# Patient Record
Sex: Female | Born: 1975 | Race: White | Hispanic: No | Marital: Married | Smoking: Former smoker
Health system: Southern US, Community
[De-identification: ages and names within clinical notes are randomized; demographics above are authoritative.]

## PROBLEM LIST (undated history)

## (undated) ENCOUNTER — Inpatient Hospital Stay: Admission: EM | Payer: Self-pay | Source: Home / Self Care

## (undated) ENCOUNTER — Inpatient Hospital Stay (HOSPITAL_COMMUNITY): Payer: Medicaid Other

## (undated) ENCOUNTER — Inpatient Hospital Stay (HOSPITAL_COMMUNITY): Payer: Self-pay

## (undated) DIAGNOSIS — J189 Pneumonia, unspecified organism: Secondary | ICD-10-CM

## (undated) DIAGNOSIS — O99320 Drug use complicating pregnancy, unspecified trimester: Secondary | ICD-10-CM

## (undated) DIAGNOSIS — R87629 Unspecified abnormal cytological findings in specimens from vagina: Secondary | ICD-10-CM

## (undated) DIAGNOSIS — O26892 Other specified pregnancy related conditions, second trimester: Secondary | ICD-10-CM

## (undated) DIAGNOSIS — O039 Complete or unspecified spontaneous abortion without complication: Secondary | ICD-10-CM

## (undated) DIAGNOSIS — Z6791 Unspecified blood type, Rh negative: Secondary | ICD-10-CM

## (undated) DIAGNOSIS — K219 Gastro-esophageal reflux disease without esophagitis: Secondary | ICD-10-CM

## (undated) DIAGNOSIS — R110 Nausea: Secondary | ICD-10-CM

## (undated) DIAGNOSIS — F419 Anxiety disorder, unspecified: Secondary | ICD-10-CM

## (undated) DIAGNOSIS — O0990 Supervision of high risk pregnancy, unspecified, unspecified trimester: Secondary | ICD-10-CM

## (undated) HISTORY — DX: Other specified pregnancy related conditions, second trimester: O26.892

## (undated) HISTORY — DX: Nausea: R11.0

## (undated) HISTORY — DX: Unspecified blood type, rh negative: Z67.91

## (undated) HISTORY — DX: Complete or unspecified spontaneous abortion without complication: O03.9

## (undated) HISTORY — DX: Drug use complicating pregnancy, unspecified trimester: O99.320

## (undated) HISTORY — DX: Supervision of high risk pregnancy, unspecified, unspecified trimester: O09.90

## (undated) HISTORY — DX: Anxiety disorder, unspecified: F41.9

---

## 2003-10-09 ENCOUNTER — Other Ambulatory Visit: Admission: RE | Admit: 2003-10-09 | Discharge: 2003-10-09 | Payer: Self-pay | Admitting: Obstetrics and Gynecology

## 2006-05-18 HISTORY — PX: SPINE SURGERY: SHX786

## 2006-11-01 ENCOUNTER — Emergency Department (HOSPITAL_COMMUNITY): Admission: EM | Admit: 2006-11-01 | Discharge: 2006-11-01 | Payer: Self-pay | Admitting: Emergency Medicine

## 2007-07-19 ENCOUNTER — Other Ambulatory Visit: Payer: Self-pay

## 2007-07-19 ENCOUNTER — Emergency Department: Payer: Self-pay | Admitting: Emergency Medicine

## 2007-08-29 ENCOUNTER — Emergency Department: Payer: Self-pay | Admitting: Emergency Medicine

## 2008-02-09 ENCOUNTER — Other Ambulatory Visit: Admission: RE | Admit: 2008-02-09 | Discharge: 2008-02-09 | Payer: Self-pay | Admitting: Family Medicine

## 2008-09-14 ENCOUNTER — Observation Stay: Payer: Self-pay | Admitting: *Deleted

## 2009-01-16 ENCOUNTER — Emergency Department (HOSPITAL_COMMUNITY): Admission: EM | Admit: 2009-01-16 | Discharge: 2009-01-16 | Payer: Self-pay | Admitting: Emergency Medicine

## 2010-04-22 ENCOUNTER — Emergency Department (HOSPITAL_COMMUNITY)
Admission: EM | Admit: 2010-04-22 | Discharge: 2010-04-23 | Disposition: A | Discharge status: Other Acute Inpatient Hospital | Payer: Self-pay | Source: Home / Self Care | Admitting: Emergency Medicine

## 2010-04-23 ENCOUNTER — Inpatient Hospital Stay (HOSPITAL_COMMUNITY)
Admission: RE | Admit: 2010-04-23 | Discharge: 2010-04-30 | Payer: Self-pay | Source: Home / Self Care | Attending: Psychiatry | Admitting: Psychiatry

## 2010-07-29 LAB — URINALYSIS, ROUTINE W REFLEX MICROSCOPIC
Bilirubin Urine: NEGATIVE
Glucose, UA: NEGATIVE mg/dL
Ketones, ur: NEGATIVE mg/dL
Leukocytes, UA: NEGATIVE
Nitrite: NEGATIVE
Protein, ur: NEGATIVE mg/dL
Specific Gravity, Urine: 1.023 (ref 1.005–1.030)
Urobilinogen, UA: 0.2 mg/dL (ref 0.0–1.0)
pH: 6.5 (ref 5.0–8.0)

## 2010-07-29 LAB — DIFFERENTIAL
Basophils Absolute: 0 10*3/uL (ref 0.0–0.1)
Basophils Relative: 0 % (ref 0–1)
Eosinophils Absolute: 0 10*3/uL (ref 0.0–0.7)
Eosinophils Relative: 0 % (ref 0–5)
Lymphocytes Relative: 12 % (ref 12–46)
Lymphs Abs: 1.6 10*3/uL (ref 0.7–4.0)
Monocytes Absolute: 0.8 10*3/uL (ref 0.1–1.0)
Monocytes Relative: 6 % (ref 3–12)
Neutro Abs: 11.4 10*3/uL — ABNORMAL HIGH (ref 1.7–7.7)
Neutrophils Relative %: 82 % — ABNORMAL HIGH (ref 43–77)

## 2010-07-29 LAB — URINE MICROSCOPIC-ADD ON

## 2010-07-29 LAB — POCT PREGNANCY, URINE
Preg Test, Ur: NEGATIVE
Preg Test, Ur: NEGATIVE

## 2010-07-29 LAB — CBC
HCT: 39 % (ref 36.0–46.0)
Hemoglobin: 13.7 g/dL (ref 12.0–15.0)
MCH: 30.6 pg (ref 26.0–34.0)
MCHC: 35.1 g/dL (ref 30.0–36.0)
MCV: 87.1 fL (ref 78.0–100.0)
Platelets: 247 10*3/uL (ref 150–400)
RBC: 4.48 MIL/uL (ref 3.87–5.11)
RDW: 13.7 % (ref 11.5–15.5)
WBC: 13.8 10*3/uL — ABNORMAL HIGH (ref 4.0–10.5)

## 2010-07-29 LAB — BASIC METABOLIC PANEL
BUN: 5 mg/dL — ABNORMAL LOW (ref 6–23)
CO2: 26 mEq/L (ref 19–32)
Calcium: 9.5 mg/dL (ref 8.4–10.5)
Chloride: 105 mEq/L (ref 96–112)
Creatinine, Ser: 0.63 mg/dL (ref 0.4–1.2)
GFR calc Af Amer: >60 mL/min (ref >60)
GFR calc non Af Amer: >60 mL/min (ref >60)
Glucose, Bld: 97 mg/dL (ref 70–99)
Potassium: 3.6 mEq/L (ref 3.5–5.1)
Sodium: 138 mEq/L (ref 135–145)

## 2010-07-29 LAB — RAPID URINE DRUG SCREEN, HOSP PERFORMED
Amphetamines: NOT DETECTED
Barbiturates: NOT DETECTED
Benzodiazepines: POSITIVE — AB
Cocaine: POSITIVE — AB
Opiates: NOT DETECTED
Tetrahydrocannabinol: POSITIVE — AB

## 2010-07-29 LAB — ETHANOL: Alcohol, Ethyl (B): <5 mg/dL (ref 0–10)

## 2010-07-29 LAB — TRICYCLICS SCREEN, URINE: TCA Scrn: NOT DETECTED

## 2011-02-01 ENCOUNTER — Emergency Department (HOSPITAL_COMMUNITY)
Admission: EM | Admit: 2011-02-01 | Discharge: 2011-02-01 | Disposition: A | Discharge status: Home / Self Care | Payer: Self-pay | Attending: Emergency Medicine | Admitting: Emergency Medicine

## 2011-02-01 DIAGNOSIS — R05 Cough: Secondary | ICD-10-CM | POA: Insufficient documentation

## 2011-02-01 DIAGNOSIS — R059 Cough, unspecified: Secondary | ICD-10-CM | POA: Insufficient documentation

## 2011-02-01 DIAGNOSIS — J40 Bronchitis, not specified as acute or chronic: Secondary | ICD-10-CM | POA: Insufficient documentation

## 2011-03-03 ENCOUNTER — Emergency Department: Payer: Self-pay | Admitting: Emergency Medicine

## 2011-03-05 ENCOUNTER — Emergency Department (HOSPITAL_COMMUNITY): Payer: Self-pay

## 2011-03-05 ENCOUNTER — Inpatient Hospital Stay (HOSPITAL_COMMUNITY)
Admission: EM | Admit: 2011-03-05 | Discharge: 2011-03-08 | DRG: 195 | Disposition: A | Discharge status: Home / Self Care | Payer: Self-pay | Attending: Internal Medicine | Admitting: Internal Medicine

## 2011-03-05 DIAGNOSIS — T380X5A Adverse effect of glucocorticoids and synthetic analogues, initial encounter: Secondary | ICD-10-CM | POA: Diagnosis not present

## 2011-03-05 DIAGNOSIS — F411 Generalized anxiety disorder: Secondary | ICD-10-CM | POA: Diagnosis present

## 2011-03-05 DIAGNOSIS — F329 Major depressive disorder, single episode, unspecified: Secondary | ICD-10-CM | POA: Diagnosis present

## 2011-03-05 DIAGNOSIS — E876 Hypokalemia: Secondary | ICD-10-CM | POA: Diagnosis present

## 2011-03-05 DIAGNOSIS — J4489 Other specified chronic obstructive pulmonary disease: Secondary | ICD-10-CM | POA: Diagnosis present

## 2011-03-05 DIAGNOSIS — J189 Pneumonia, unspecified organism: Principal | ICD-10-CM | POA: Diagnosis present

## 2011-03-05 DIAGNOSIS — J449 Chronic obstructive pulmonary disease, unspecified: Secondary | ICD-10-CM | POA: Diagnosis present

## 2011-03-05 DIAGNOSIS — F3289 Other specified depressive episodes: Secondary | ICD-10-CM | POA: Diagnosis present

## 2011-03-05 DIAGNOSIS — E139 Other specified diabetes mellitus without complications: Secondary | ICD-10-CM | POA: Diagnosis not present

## 2011-03-05 DIAGNOSIS — F17200 Nicotine dependence, unspecified, uncomplicated: Secondary | ICD-10-CM | POA: Diagnosis present

## 2011-03-05 LAB — POCT I-STAT TROPONIN I: Troponin i, poc: 0.02 ng/mL (ref 0.00–0.08)

## 2011-03-05 LAB — URINALYSIS, ROUTINE W REFLEX MICROSCOPIC
Bilirubin Urine: NEGATIVE
Glucose, UA: NEGATIVE mg/dL
Hgb urine dipstick: NEGATIVE
Ketones, ur: NEGATIVE mg/dL
Leukocytes, UA: NEGATIVE
Nitrite: NEGATIVE
Protein, ur: NEGATIVE mg/dL
Specific Gravity, Urine: 1.008 (ref 1.005–1.030)
Urobilinogen, UA: 1 mg/dL (ref 0.0–1.0)
pH: 6 (ref 5.0–8.0)

## 2011-03-05 LAB — RAPID STREP SCREEN (MED CTR MEBANE ONLY): Streptococcus, Group A Screen (Direct): NEGATIVE

## 2011-03-05 LAB — CBC
HCT: 39.3 % (ref 36.0–46.0)
Hemoglobin: 14.1 g/dL (ref 12.0–15.0)
MCH: 31.2 pg (ref 26.0–34.0)
MCHC: 35.9 g/dL (ref 30.0–36.0)
MCV: 86.9 fL (ref 78.0–100.0)
Platelets: 246 10*3/uL (ref 150–400)
RBC: 4.52 MIL/uL (ref 3.87–5.11)
RDW: 13.7 % (ref 11.5–15.5)
WBC: 25.1 10*3/uL — ABNORMAL HIGH (ref 4.0–10.5)

## 2011-03-05 LAB — POCT PREGNANCY, URINE: Preg Test, Ur: NEGATIVE

## 2011-03-05 LAB — RAPID URINE DRUG SCREEN, HOSP PERFORMED
Amphetamines: NOT DETECTED
Barbiturates: NOT DETECTED
Benzodiazepines: NOT DETECTED
Cocaine: NOT DETECTED
Opiates: NOT DETECTED
Tetrahydrocannabinol: NOT DETECTED

## 2011-03-05 LAB — DIFFERENTIAL
Basophils Absolute: 0 10*3/uL (ref 0.0–0.1)
Basophils Relative: 0 % (ref 0–1)
Eosinophils Absolute: 0 10*3/uL (ref 0.0–0.7)
Eosinophils Relative: 0 % (ref 0–5)
Lymphocytes Relative: 9 % — ABNORMAL LOW (ref 12–46)
Lymphs Abs: 2.1 10*3/uL (ref 0.7–4.0)
Monocytes Absolute: 1.4 10*3/uL — ABNORMAL HIGH (ref 0.1–1.0)
Monocytes Relative: 6 % (ref 3–12)
Neutro Abs: 21.5 10*3/uL — ABNORMAL HIGH (ref 1.7–7.7)
Neutrophils Relative %: 86 % — ABNORMAL HIGH (ref 43–77)

## 2011-03-05 LAB — BASIC METABOLIC PANEL
BUN: 8 mg/dL (ref 6–23)
CO2: 25 mEq/L (ref 19–32)
Calcium: 9.8 mg/dL (ref 8.4–10.5)
Chloride: 96 mEq/L (ref 96–112)
Creatinine, Ser: 0.79 mg/dL (ref 0.50–1.10)
GFR calc Af Amer: >90 mL/min (ref >90)
GFR calc non Af Amer: >90 mL/min (ref >90)
Glucose, Bld: 150 mg/dL — ABNORMAL HIGH (ref 70–99)
Potassium: 3.1 mEq/L — ABNORMAL LOW (ref 3.5–5.1)
Sodium: 131 mEq/L — ABNORMAL LOW (ref 135–145)

## 2011-03-05 LAB — D-DIMER, QUANTITATIVE: D-Dimer, Quant: 0.65 ug/mL-FEU — ABNORMAL HIGH (ref 0.00–0.48)

## 2011-03-05 MED ORDER — IOHEXOL 300 MG/ML  SOLN
100.0000 mL | Freq: Once | INTRAMUSCULAR | Status: AC | PRN
  Administered 2011-03-05: 100 mL via INTRAVENOUS

## 2011-03-06 LAB — DIFFERENTIAL
Basophils Absolute: 0 10*3/uL (ref 0.0–0.1)
Basophils Relative: 0 % (ref 0–1)
Eosinophils Absolute: 0.2 10*3/uL (ref 0.0–0.7)
Eosinophils Relative: 1 % (ref 0–5)
Lymphocytes Relative: 13 % (ref 12–46)
Lymphs Abs: 2.3 10*3/uL (ref 0.7–4.0)
Monocytes Absolute: 0.8 10*3/uL (ref 0.1–1.0)
Monocytes Relative: 5 % (ref 3–12)
Neutro Abs: 13.6 10*3/uL — ABNORMAL HIGH (ref 1.7–7.7)
Neutrophils Relative %: 81 % — ABNORMAL HIGH (ref 43–77)

## 2011-03-06 LAB — HEMOGLOBIN A1C
Hgb A1c MFr Bld: 5 % (ref <5.7)
Mean Plasma Glucose: 97 mg/dL (ref <117)

## 2011-03-06 LAB — URINE CULTURE
Colony Count: NO GROWTH
Culture  Setup Time: 201210181506
Culture: NO GROWTH

## 2011-03-06 LAB — COMPREHENSIVE METABOLIC PANEL
ALT: 52 U/L — ABNORMAL HIGH (ref 0–35)
AST: 43 U/L — ABNORMAL HIGH (ref 0–37)
Albumin: 3 g/dL — ABNORMAL LOW (ref 3.5–5.2)
Alkaline Phosphatase: 69 U/L (ref 39–117)
BUN: 9 mg/dL (ref 6–23)
CO2: 25 mEq/L (ref 19–32)
Calcium: 8.9 mg/dL (ref 8.4–10.5)
Chloride: 105 mEq/L (ref 96–112)
Creatinine, Ser: 0.61 mg/dL (ref 0.50–1.10)
GFR calc Af Amer: >90 mL/min (ref >90)
GFR calc non Af Amer: >90 mL/min (ref >90)
Glucose, Bld: 107 mg/dL — ABNORMAL HIGH (ref 70–99)
Potassium: 3.8 mEq/L (ref 3.5–5.1)
Sodium: 138 mEq/L (ref 135–145)
Total Bilirubin: 0.3 mg/dL (ref 0.3–1.2)
Total Protein: 6.8 g/dL (ref 6.0–8.3)

## 2011-03-06 LAB — CBC
HCT: 36.1 % (ref 36.0–46.0)
Hemoglobin: 12.5 g/dL (ref 12.0–15.0)
MCH: 30.5 pg (ref 26.0–34.0)
MCHC: 34.6 g/dL (ref 30.0–36.0)
MCV: 88 fL (ref 78.0–100.0)
Platelets: 184 10*3/uL (ref 150–400)
RBC: 4.1 MIL/uL (ref 3.87–5.11)
RDW: 14.1 % (ref 11.5–15.5)
WBC: 16.8 10*3/uL — ABNORMAL HIGH (ref 4.0–10.5)

## 2011-03-06 LAB — PHOSPHORUS: Phosphorus: 2.6 mg/dL (ref 2.3–4.6)

## 2011-03-06 LAB — TSH: TSH: 3.216 u[IU]/mL (ref 0.350–4.500)

## 2011-03-06 LAB — MAGNESIUM: Magnesium: 2 mg/dL (ref 1.5–2.5)

## 2011-03-07 LAB — COMPREHENSIVE METABOLIC PANEL
ALT: 50 U/L — ABNORMAL HIGH (ref 0–35)
AST: 40 U/L — ABNORMAL HIGH (ref 0–37)
Albumin: 2.6 g/dL — ABNORMAL LOW (ref 3.5–5.2)
Alkaline Phosphatase: 66 U/L (ref 39–117)
BUN: 5 mg/dL — ABNORMAL LOW (ref 6–23)
CO2: 24 mEq/L (ref 19–32)
Calcium: 8.8 mg/dL (ref 8.4–10.5)
Chloride: 107 mEq/L (ref 96–112)
Creatinine, Ser: 0.6 mg/dL (ref 0.50–1.10)
GFR calc Af Amer: >90 mL/min (ref >90)
GFR calc non Af Amer: >90 mL/min (ref >90)
Glucose, Bld: 96 mg/dL (ref 70–99)
Potassium: 3.6 mEq/L (ref 3.5–5.1)
Sodium: 139 mEq/L (ref 135–145)
Total Bilirubin: 0.2 mg/dL — ABNORMAL LOW (ref 0.3–1.2)
Total Protein: 6.1 g/dL (ref 6.0–8.3)

## 2011-03-07 LAB — CBC
HCT: 31.8 % — ABNORMAL LOW (ref 36.0–46.0)
Hemoglobin: 10.8 g/dL — ABNORMAL LOW (ref 12.0–15.0)
MCH: 30.2 pg (ref 26.0–34.0)
MCHC: 34 g/dL (ref 30.0–36.0)
MCV: 88.8 fL (ref 78.0–100.0)
Platelets: 209 10*3/uL (ref 150–400)
RBC: 3.58 MIL/uL — ABNORMAL LOW (ref 3.87–5.11)
RDW: 13.9 % (ref 11.5–15.5)
WBC: 9.9 10*3/uL (ref 4.0–10.5)

## 2011-03-07 LAB — DIFFERENTIAL
Basophils Absolute: 0 10*3/uL (ref 0.0–0.1)
Basophils Relative: 0 % (ref 0–1)
Eosinophils Absolute: 0.4 10*3/uL (ref 0.0–0.7)
Eosinophils Relative: 4 % (ref 0–5)
Lymphocytes Relative: 20 % (ref 12–46)
Lymphs Abs: 2 10*3/uL (ref 0.7–4.0)
Monocytes Absolute: 0.5 10*3/uL (ref 0.1–1.0)
Monocytes Relative: 5 % (ref 3–12)
Neutro Abs: 7 10*3/uL (ref 1.7–7.7)
Neutrophils Relative %: 71 % (ref 43–77)

## 2011-03-07 LAB — MAGNESIUM: Magnesium: 1.8 mg/dL (ref 1.5–2.5)

## 2011-03-08 ENCOUNTER — Inpatient Hospital Stay (HOSPITAL_COMMUNITY): Payer: Self-pay

## 2011-03-08 LAB — CBC
HCT: 34.2 % — ABNORMAL LOW (ref 36.0–46.0)
Hemoglobin: 12.1 g/dL (ref 12.0–15.0)
MCH: 30.6 pg (ref 26.0–34.0)
MCHC: 35.4 g/dL (ref 30.0–36.0)
MCV: 86.4 fL (ref 78.0–100.0)
Platelets: 260 10*3/uL (ref 150–400)
RBC: 3.96 MIL/uL (ref 3.87–5.11)
RDW: 13.6 % (ref 11.5–15.5)
WBC: 10 10*3/uL (ref 4.0–10.5)

## 2011-03-08 LAB — DIFFERENTIAL
Basophils Absolute: 0 10*3/uL (ref 0.0–0.1)
Basophils Relative: 0 % (ref 0–1)
Eosinophils Absolute: 0 10*3/uL (ref 0.0–0.7)
Eosinophils Relative: 0 % (ref 0–5)
Lymphocytes Relative: 12 % (ref 12–46)
Lymphs Abs: 1.2 10*3/uL (ref 0.7–4.0)
Monocytes Absolute: 0.3 10*3/uL (ref 0.1–1.0)
Monocytes Relative: 3 % (ref 3–12)
Neutro Abs: 8.5 10*3/uL — ABNORMAL HIGH (ref 1.7–7.7)
Neutrophils Relative %: 85 % — ABNORMAL HIGH (ref 43–77)

## 2011-03-08 LAB — COMPREHENSIVE METABOLIC PANEL
ALT: 50 U/L — ABNORMAL HIGH (ref 0–35)
AST: 28 U/L (ref 0–37)
Albumin: 3.1 g/dL — ABNORMAL LOW (ref 3.5–5.2)
Alkaline Phosphatase: 75 U/L (ref 39–117)
BUN: 6 mg/dL (ref 6–23)
CO2: 24 mEq/L (ref 19–32)
Calcium: 9.8 mg/dL (ref 8.4–10.5)
Chloride: 102 mEq/L (ref 96–112)
Creatinine, Ser: 0.53 mg/dL (ref 0.50–1.10)
GFR calc Af Amer: >90 mL/min (ref >90)
GFR calc non Af Amer: >90 mL/min (ref >90)
Glucose, Bld: 158 mg/dL — ABNORMAL HIGH (ref 70–99)
Potassium: 3.8 mEq/L (ref 3.5–5.1)
Sodium: 136 mEq/L (ref 135–145)
Total Bilirubin: 0.2 mg/dL — ABNORMAL LOW (ref 0.3–1.2)
Total Protein: 7.4 g/dL (ref 6.0–8.3)

## 2011-03-08 LAB — CULTURE, RESPIRATORY W GRAM STAIN

## 2011-03-08 LAB — CULTURE, RESPIRATORY

## 2011-03-08 LAB — MAGNESIUM: Magnesium: 1.8 mg/dL (ref 1.5–2.5)

## 2011-03-08 NOTE — Discharge Summary (Signed)
NAME:  Daisy Tucker, Daisy Tucker NO.:  [id number]  MEDICAL RECORD NO.:  [id number]  LOCATION:  1537                         FACILITY:  [facility]  PHYSICIAN:  Talmage Nap, MD  DATE OF BIRTH:  [birth year]  DATE OF ADMISSION:  03/05/2011 DATE OF DISCHARGE:  03/08/2011                        DISCHARGE SUMMARY - REFERRING   PRIMARY CARE PHYSICIAN:  Unassigned.  DISCHARGE DIAGNOSES: 1. Left lower lobe pneumonia. 2. Questionable chronic obstructive pulmonary disease. 3. Chronic tobacco use. 4. Hyperglycemia secondary to steroid use. 5. Anxiety disorder. 6. History of depression. 7. Chronic substance abuse.  The patient is a 35 year old Caucasian female with history of anxiety disorder, depression, and substance abuse, on methadone; who was admitted to the hospital on March 05, 2011, by Dr. Kathlen Mody with 1- day history of fever.  Prior to this admission 3 weeks ago the patient had been on treatment for bronchitis with amoxicillin.  She at that time presented with cough and shortness of breath, and also had a fever of 103.  The cough was said to be associated with substernal chest pain and also pleuritic.  Cough was also said to be productive of sputum.  She denied any nausea.  She denied any diarrhea or abdominal pain.  Symptom has said to have persisted on off for 3 weeks and a day prior to presenting to the emergency room, the patient had fever and subsequently presented to the hospital to be evaluated.  PREADMISSION MEDICATIONS WITHOUT DOSAGES:  Include Klonopin, methadone, and Phenergan.  ALLERGIES:  To ASPIRIN and ZOFRAN.  SOCIAL HISTORY:  The patient lives at home with her spouse.  Smokes cigarettes on a regular basis, cannot quantify how much she smokes and denies any history of alcohol or street drug use.  PAST SURGICAL HISTORY:  Lower back surgery.  REVIEW OF SYSTEMS:  Essentially documented in the initial history and physical.  PHYSICAL  EXAMINATION:  At the time the patient was seen by the admitting physician; VITAL SIGNS:  Temperature was 99.5, pulse 105, respiratory rate 20, saturating 96% on room air, and blood pressure was 118/80.  She was said to have a low-grade fever. HEENT:  Pupils are reactive to light and extraocular muscles are intact. NECK:  She had no jugular venous distention.  No carotid bruit.  No lymphadenopathy. CHEST EXAMINATION:  Showed decreased air entry in the left lower lung base with no rales. HEART:  Heart sounds are 1 and 2.  ABDOMEN:  Soft, nontender.  Liver, spleen, and kidney not palpable.  Bowel sounds are positive. EXTREMITIES:  No pedal edema. NEUROLOGIC EXAM:  Nonfocal.  MUSCULOSKELETAL SYSTEM:  Unremarkable. SKIN:  Showed normal turgor.  LABORATORY DATA:  Initial rapid stress test was negative.  Urinalysis unremarkable.  First set of cardiac markers, troponin-I.  0.02. Pregnancy test was negative.  D-dimer 0.65.  Complete blood count with differential showed WBC of 25.1, hemoglobin of 14.1, hematocrit of 39.3, MCV of 86.9 with a platelet count of 246, neutrophils 86%, and absolute neutrophil count is 21.5.  Basic metabolic panel shows sodium of 131, potassium of 3.1, chloride of 96 with a bicarb of 25, and glucose is 150.  BUN is 8, creatinine 0.79.  Urine drug screen negative.  Hemoglobin A1c is 5.0.  A repeat complete blood count with differential done on March 06, 2011, showed WBC of 16.9, hemoglobin of 12.5, hematocrit of 36.1, MCV of 88.0, platelet count of 184, neutrophils 81%, and absolute neutrophil count is 13.6.  Phosphorus level 2.6.  TSH 3.216, normal.  Urine culture, no growth.  Blood culture, no growth. Upper respiratory culture showed abundant wbc's present predominant for PMN, and a few gram-positive cocci in pairs, rare gram-negative rods; and a repeat complete blood count with differential done on March 08, 2011, showed WBC of 10.0, hemoglobin of 12.1,  hematocrit of 34.2, MCV of 86.4 with a platelet count of 260, neutrophils 85%, and absolute neutrophil count is 8.1. Comprehensive metabolic panel shows sodium of 136, potassium of 3.8, chloride of 102 with a bicarb of 24, glucose is 158, BUN is 6 , and creatinine is 0.53.  LFTs showed total bilirubin 0.2, alkaline phosphatase 75, AST 28, and ALT 50.  IMAGING STUDIES:  Done include chest x-ray, which showed left lower lobe pneumonia.  CT angiogram showed dense consolidative left lower lobe airspace disease consistent with pneumonia.  There is limited pulmonary arterial opacification peripherally.  HOSPITAL COURSE:  The patient admitted to general medical floor, started on normal saline IV to go at rate of 100 mL an hour and Phenergan was given for nausea and Lovenox 40 mg subcu q.24 for DVT prophylaxis.  The patient was also started on albuterol and Atrovent nebs q.6 hours p.r.n. Pain control was done with Tylenol as well as morphine 4 mg IV q.4 p.r.n.  The patient was started on IV Rocephin as well as IV Zithromax and dosing was done by Pharmacy.  She was restarted on hold home meds, which include Klonopin 1 mg p.o. t.i.d. and was put on nicotine transdermal patch 40 mg q.24 hours.  Also given to the patient was potassium chloride 40 mEq p.o. b.i.d. x1 dose since the patient was found to be hypokalemic initially.  The patient was seen by me for the very first time in this admission on March 06, 2011, and during this encounter, she complained about postnasal drip, as well as insomnia, examination showed crackles, right lung base.  At this point, the patient was given Claritin-D 1 p.o. daily and Ambien 5 mg p.o. at bedtime for insomnia.  She was re-evaluated by me again on March 07, 2011, and examination of the lungs showed scattered rhonchi all over the lung fields.  At this point, the patient's albuterol and Atrovent nebs were changed to q.4 scheduled and Solu-Medrol 80 mg IV q.12  was added to the patient's regimen.  She was re-evaluated by me today which is March 08, 2011, very much improved in any postnasal drip.  Cough is less.  She denied any fever.  She denied any chills.  She denied any rhonchi.  Examination of the lungs showed very minimal scattered rhonchi and no rales.  Vital Signs:  Blood pressure is 110/88, temperature is 97.9, pulse is 85, and respiratory rate 18, medically stable.  Plan is for the patient to be discharged home today on activity as tolerated. Smoking cessation.  She will be followed up by her primary care physician in 1-2 weeks.  MEDICATION TO BE TAKEN AT HOME:  Include: 1. Avelox 400 mg 1 p.o. daily for 10 days. 2. Combivent inhaler 2 puffs q.4 p.r.n. 3. Loratadine 10 mg 1 p.o. daily p.r.n. for nasal congestion. 4. Metformin 500 mg 1 p.o. daily for 5 days since the  patient is going     to be on tapered doses of steroid. 5. Nicotine transdermal patch 14 mg q.24 hours. 6. Tapered doses of steroids starting with prednisone 50 mg p.o. daily     x1, 40 mg p.o. daily x1, 30 mg p.o. daily x1, 20 mg p.o. daily x1,     and 10 mg p.o. daily x1, and thereafter discontinued. 7. Clonazepam 1 mg p.o. t.i.d. 8. Gabapentin 200 mg 1 p.o. t.i.d. 9. Promethazine 25 mg 1-2 tablets p.o. daily p.r.n. 10.Quetiapine SR 50  mg 2 tablets p.o. daily.     Talmage Nap, MD     CN/MEDQ  D:  03/08/2011  T:  03/08/2011  Job:  [id number]  Electronically Signed by Talmage Nap  on 03/08/2011 04:43:43 PM

## 2011-03-10 NOTE — H&P (Signed)
NAME:  Daisy Tucker, Daisy Tucker NO.:  [id number]  MEDICAL RECORD NO.:  [id number]  LOCATION:  WLED                         FACILITY:  [facility]  PHYSICIAN:  Kathlen Mody, MD       DATE OF BIRTH:  1975-12-27  DATE OF ADMISSION:  03/05/2011 DATE OF DISCHARGE:                             HISTORY & PHYSICAL   CHIEF COMPLAINT:  Fever since last night.  HISTORY OF PRESENT ILLNESS:  This is a 35 year old lady with history of anxiety; depression; ex-heroin abuser, on methadone, came in to ER 3 weeks ago for bronchitis, was discharged home on amoxicillin.  Since then, the patient states that her symptoms of bronchitis, cough, shortness of breath have not completely resolved; and since last night, the patient had a fever as high as 103, substernal chest pain, and left- sided lateral chest pain associated with shortness of breath and productive sputum.  She denies any nausea, has vomiting from persistent cough.  Denies any diarrhea or abdominal pain.  Denies any travel recently or any sick contacts.  She denies any urinary complaints.  She states that she has been off drugs for more than a year, but in the [place], I see an admission to [facility] last December 2011 for depression and cocaine abuse.  The patient, at this time, denies any headache, blurry vision.  The patient denies any depression at this time or any suicidal ideation.  She admits to being anxious and panic attacks and is on Klonopin for that.  REVIEW OF SYSTEMS:  See HPI, otherwise negative.  PAST MEDICAL HISTORY: 1. Anxiety. 2. Depression. 3. Heroin abuser, on methadone.  PAST SURGICAL HISTORY:  Back surgery many years ago.  ALLERGIES:  The patient is allergic to ASPIRIN and ZOFRAN.  SOCIAL HISTORY:  The patient lives at home with her husband.  Currently smokes cigarettes.  Denies alcohol or any recreational drug use.  FAMILY HISTORY:  Nil significant.  HOME MEDICATIONS: 1. Klonopin. 2.  Methadone. 3. Phenergan.  PHYSICAL EXAMINATION:  VITAL SIGNS:  Temperature of 99.5, pulse of 105, respiratory rate 20 per minute, saturating 96% on room air.  Blood pressure of 118/80. GENERAL:  On exam, she has low-grade temperature, comfortable, in no acute distress. HEENT EXAM:  Pupils reacting to light and accommodation.  No JVD.  No scleral icterus.  Dry mucous membranes. CARDIOVASCULAR:  S1, S2 heard; slightly tachycardic.  No murmurs, rubs, or gallops.  RESPIRATORY EXAM:  Decreased air entry on the left lower base.  No wheezing, rhonchi, or rales. ABDOMEN:  Soft, nontender, nondistended.  Bowel sounds are heard. EXTREMITIES:  No pedal edema, cyanosis, or clubbing. NEUROLOGICAL:  Nonfocal.  PERTINENT LABS:  The patient had a rapid strep test which was negative. Urinalysis negative for nitrites and leukocytes.  Point-of-care troponin negative.  Urine pregnancy test negative.  D-dimer slightly elevated. CBC showed an elevated WBC count of 25,000.  Basic metabolic panel showed a sodium of 131 and a potassium of 3.1, glucose of 150.  DIAGNOSTIC STUDIES:  The patient had a CT angiogram which showed dense consolidative left lower lobe airspace disease consistent with pneumonia.  No pulmonary embolism.  Two-view chest x-ray shows left lower lobe pneumonia.  ASSESSMENT/PLAN:  A 35 year old lady who came in for chest pain, shortness of breath, productive cough; who is being admitted for community-acquired pneumonia.  We will start the patient on IV Rocephin and IV Zithromax.  We will get sputum for culture, get a repeat chest x- ray in 24 to 48 hours.  Get urine for streptococcal antigen and Legionella antigen.  Hypokalemia will be repleted.  Hyponatremia, most likely secondary to dehydration versus pneumonia.  We will repeat levels in a.m.  History of heroin abuse on methadone.  We will also get a urine drug screen.  Anxiety, continue with her Klonopin.  Hyperglycemia with  a blood sugar of 150.  Get hemoglobin A1c.  Deep venous thrombosis prophylaxis.  Subcutaneous Lovenox dosing as per the pharmacy.  The patient is full code.          ______________________________ Kathlen Mody, MD     VA/MEDQ  D:  03/05/2011  T:  03/05/2011  Job:  [id number]  Electronically Signed by Kathlen Mody MD on 03/10/2011 03:03:46 PM

## 2011-03-12 LAB — CULTURE, BLOOD (ROUTINE X 2)
Culture  Setup Time: 201210190217
Culture: NO GROWTH

## 2011-05-25 ENCOUNTER — Ambulatory Visit: Payer: Self-pay

## 2011-05-25 DIAGNOSIS — R112 Nausea with vomiting, unspecified: Secondary | ICD-10-CM

## 2011-05-25 DIAGNOSIS — F411 Generalized anxiety disorder: Secondary | ICD-10-CM

## 2011-05-28 ENCOUNTER — Ambulatory Visit: Payer: Self-pay | Admitting: Physician Assistant

## 2011-06-24 ENCOUNTER — Other Ambulatory Visit: Payer: Self-pay | Admitting: Physician Assistant

## 2011-07-21 ENCOUNTER — Other Ambulatory Visit: Payer: Self-pay | Admitting: Physician Assistant

## 2011-08-21 ENCOUNTER — Ambulatory Visit: Payer: Self-pay | Admitting: Family Medicine

## 2011-08-21 VITALS — BP 145/99 | HR 78 | Temp 98.0°F | Resp 18 | Ht 70.5 in | Wt 241.0 lb

## 2011-08-21 DIAGNOSIS — F419 Anxiety disorder, unspecified: Secondary | ICD-10-CM

## 2011-08-21 DIAGNOSIS — F411 Generalized anxiety disorder: Secondary | ICD-10-CM

## 2011-08-21 DIAGNOSIS — R110 Nausea: Secondary | ICD-10-CM

## 2011-08-21 MED ORDER — PROMETHAZINE HCL 25 MG PO TABS: 25.0000 mg | ORAL_TABLET | Freq: Three times a day (TID) | ORAL | Status: DC | PRN

## 2011-08-21 MED ORDER — CLONAZEPAM 1 MG PO TABS: 1.0000 mg | ORAL_TABLET | Freq: Three times a day (TID) | ORAL | Status: DC | PRN

## 2011-08-21 NOTE — Progress Notes (Signed)
  Patient Name: Daisy Tucker Date of Birth: [birth year] Medical Record Number: [id number] Gender: female Date of Encounter: 08/21/2011  History of Present Illness:  Daisy Tucker is a 36 y.o. very pleasant female patient who presents with the following:  Needs a refill on her klonopin and pnenergan. Usually sees chelle.  Uses phenergan as needed for nausea- she is in the process of "figuring out why" she has nausea- they think this may be due to her gallbladder.  They are waiting until she has health insurance before she has a GI evaluation She is doing well with her klonopin and her GI symptoms are stable.  Feels fine right now  There is no problem list on file for this patient.  No past medical history on file. No past surgical history on file. History  Substance Use Topics  . Smoking status: Current Everyday Smoker -- 1.0 packs/day    Types: Cigarettes  . Smokeless tobacco: Not on file  . Alcohol Use: Not on file   No family history on file. Allergies  Allergen Reactions  . Asa Arthritis Strength-Antacid (Aspirin Buffered)   . Zofran     Medication list has been reviewed and updated.  Review of Systems: As per HPI- otherwise negative.Marland Kitchen  Physical Examination: Filed Vitals:   08/21/11 1457  BP: 145/99  Pulse: 78  Temp: 98 F (36.7 C)  TempSrc: Oral  Resp: 18  Height: 5' 10.5" (1.791 m)  Weight: 241 lb (109.317 kg)    Body mass index is 34.09 kg/(m^2).  GEN: WDWN, NAD, Non-toxic, A & O x 3 HEENT: Atraumatic, Normocephalic. Neck supple. No masses, No LAD. Ears and Nose: No external deformity. CV: RRR, No M/G/R. No JVD. No thrill. No extra heart sounds. PULM: CTA B, no wheezes, crackles, rhonchi. No retractions. No resp. distress. No accessory muscle use. ABD: S, NT, ND. EXTR: No c/c/e NEURO Normal gait.  PSYCH: Normally interactive. Conversant. Not depressed or anxious appearing.  Calm demeanor. .   Assessment and Plan: 1. Anxiety  clonazePAM  (KLONOPIN) 1 MG tablet  2. Nausea  promethazine (PHENERGAN) 25 MG tablet    Refilled meds as above.  She plans to look further into her abdominal problems/ frequent vomiting as soon as finances allow.  Sooner if worse.

## 2011-10-01 ENCOUNTER — Encounter: Payer: Self-pay | Admitting: Physician Assistant

## 2011-10-01 ENCOUNTER — Ambulatory Visit: Payer: Self-pay

## 2011-10-01 ENCOUNTER — Ambulatory Visit (INDEPENDENT_AMBULATORY_CARE_PROVIDER_SITE_OTHER): Payer: Self-pay | Admitting: Emergency Medicine

## 2011-10-01 VITALS — BP 122/82 | HR 101 | Temp 98.0°F | Resp 16 | Ht 69.5 in | Wt 243.0 lb

## 2011-10-01 DIAGNOSIS — R7309 Other abnormal glucose: Secondary | ICD-10-CM

## 2011-10-01 DIAGNOSIS — R739 Hyperglycemia, unspecified: Secondary | ICD-10-CM

## 2011-10-01 DIAGNOSIS — M25569 Pain in unspecified knee: Secondary | ICD-10-CM

## 2011-10-01 DIAGNOSIS — R635 Abnormal weight gain: Secondary | ICD-10-CM

## 2011-10-01 DIAGNOSIS — R609 Edema, unspecified: Secondary | ICD-10-CM

## 2011-10-01 LAB — COMPREHENSIVE METABOLIC PANEL
ALT: 99 U/L — ABNORMAL HIGH (ref 0–35)
AST: 119 U/L — ABNORMAL HIGH (ref 0–37)
Albumin: 4.3 g/dL (ref 3.5–5.2)
Alkaline Phosphatase: 106 U/L (ref 39–117)
BUN: 12 mg/dL (ref 6–23)
CO2: 27 mEq/L (ref 19–32)
Calcium: 9.8 mg/dL (ref 8.4–10.5)
Chloride: 100 mEq/L (ref 96–112)
Creat: 0.66 mg/dL (ref 0.50–1.10)
Glucose, Bld: 103 mg/dL — ABNORMAL HIGH (ref 70–99)
Potassium: 4.5 mEq/L (ref 3.5–5.3)
Sodium: 136 mEq/L (ref 135–145)
Total Bilirubin: 0.4 mg/dL (ref 0.3–1.2)
Total Protein: 7.3 g/dL (ref 6.0–8.3)

## 2011-10-01 LAB — POCT CBC
Granulocyte percent: 70.7 %G (ref 37–80)
HCT, POC: 43.9 % (ref 37.7–47.9)
Hemoglobin: 14.5 g/dL (ref 12.2–16.2)
Lymph, poc: 2.3 (ref 0.6–3.4)
MCH, POC: 31.7 pg — AB (ref 27–31.2)
MCHC: 33 g/dL (ref 31.8–35.4)
MCV: 95.8 fL (ref 80–97)
MID (cbc): 0.6 (ref 0–0.9)
MPV: 8.2 fL (ref 0–99.8)
POC Granulocyte: 6.9 (ref 2–6.9)
POC LYMPH PERCENT: 23.3 %L (ref 10–50)
POC MID %: 6 %M (ref 0–12)
Platelet Count, POC: 332 10*3/uL (ref 142–424)
RBC: 4.58 M/uL (ref 4.04–5.48)
RDW, POC: 12.9 %
WBC: 9.8 10*3/uL (ref 4.6–10.2)

## 2011-10-01 LAB — POCT UA - MICROSCOPIC ONLY
Bacteria, U Microscopic: NEGATIVE
Casts, Ur, LPF, POC: NEGATIVE
Crystals, Ur, HPF, POC: NEGATIVE
Mucus, UA: NEGATIVE
RBC, urine, microscopic: NEGATIVE
Yeast, UA: NEGATIVE

## 2011-10-01 LAB — TSH: TSH: 1.021 u[IU]/mL (ref 0.350–4.500)

## 2011-10-01 LAB — POCT URINALYSIS DIPSTICK
Bilirubin, UA: NEGATIVE
Blood, UA: NEGATIVE
Glucose, UA: NEGATIVE
Ketones, UA: NEGATIVE
Leukocytes, UA: NEGATIVE
Nitrite, UA: NEGATIVE
Protein, UA: NEGATIVE
Spec Grav, UA: 1.01
Urobilinogen, UA: 0.2
pH, UA: 6

## 2011-10-01 LAB — GLUCOSE, POCT (MANUAL RESULT ENTRY): POC Glucose: 113 mg/dl — AB (ref 70–99)

## 2011-10-01 LAB — POCT GLYCOSYLATED HEMOGLOBIN (HGB A1C): Hemoglobin A1C: 5

## 2011-10-01 NOTE — Progress Notes (Signed)
Subjective:    Patient ID: Daisy Tucker, female    DOB: 04-Nov-1975, 36 y.o.   MRN: [id number]  HPI  C/O swelling all over, worse in the right leg. Began in late March.  Muscle cramps in legs.  "I'm retaining water."  Right knee pain-worse with sit-to-stand.  Now left knee starting to hurt, too, relying on it more. Concerned about significant weight gain-reports weighing about 185 lbs when admitted to [facility] in 04/23/2011. She notes that her rings no longer fit. Has developed a cough since she has started quitting smoking. No chest pain, SOB, HA, dizziness, vision change, N/V, diarrhea, dysuria, rash. No skin changes, hair changes.   Review of Systems As above.    Objective:   Physical Exam Vital signs noted. Well-developed, well nourished WF who is awake, alert and oriented, in NAD. HEENT: [place]/AT, PERRL, EOMI.  Sclera and conjunctiva are clear.  Funduscopic exam is normal bilaterally. EAC are patent, TMs are normal in appearance. Nasal mucosa is pink and moist. OP is clear. Neck: supple, non-tender, no lymphadenopathy, thyromegaly. Heart: RRR, no murmur Lungs: CTA Extremities: no cyanosis, clubbing.  1-2+ pitting edema of the right lower extremity. Trace edema of the left lower extremity. No tenderness of the calf. No increased warmth. Skin: warm and dry without rash. No erythema.  Results for orders placed in visit on 10/01/11  POCT CBC      Component Value Range   WBC 9.8  4.6 - 10.2 (K/uL)   Lymph, poc 2.3  0.6 - 3.4    POC LYMPH PERCENT 23.3  10 - 50 (%L)   MID (cbc) 0.6  0 - 0.9    POC MID % 6.0  0 - 12 (%M)   POC Granulocyte 6.9  2 - 6.9    Granulocyte percent 70.7  37 - 80 (%G)   RBC 4.58  4.04 - 5.48 (M/uL)   Hemoglobin 14.5  12.2 - 16.2 (g/dL)   HCT, POC 29.5  28.4 - 47.9 (%)   MCV 95.8  80 - 97 (fL)   MCH, POC 31.7 (*) 27 - 31.2 (pg)   MCHC 33.0  31.8 - 35.4 (g/dL)   RDW, POC 13.2     Platelet Count, POC 332  142 - 424 (K/uL)   MPV 8.2  0 - 99.8 (fL)  GLUCOSE,  POCT (MANUAL RESULT ENTRY)      Component Value Range   POC Glucose 113 (*) 70 - 99 (mg/dl)  POCT UA - MICROSCOPIC ONLY      Component Value Range   WBC, Ur, HPF, POC 0-1     RBC, urine, microscopic neg     Bacteria, U Microscopic neg     Mucus, UA neg     Epithelial cells, urine per micros 0-4     Crystals, Ur, HPF, POC neg     Casts, Ur, LPF, POC neg     Yeast, UA neg    POCT URINALYSIS DIPSTICK      Component Value Range   Color, UA yellow     Clarity, UA clear     Glucose, UA neg     Bilirubin, UA neg     Ketones, UA neg     Spec Grav, UA 1.010     Blood, UA neg     pH, UA 6.0     Protein, UA neg     Urobilinogen, UA 0.2     Nitrite, UA neg     Leukocytes, UA Negative  POCT GLYCOSYLATED HEMOGLOBIN (HGB A1C)      Component Value Range   Hemoglobin A1C 5.0      Right Knee: [facility] reading (PRIMARY) by  Dr. Cleta Alberts. Normal knee.      Assessment & Plan:   1. Knee pain  DG Knee Complete 4 Views Right  2. Weight gain  POCT CBC, POCT glucose (manual entry), TSH, Comprehensive metabolic panel  3. Edema  POCT CBC, POCT UA - Microscopic Only, POCT urinalysis dipstick, TSH, Comprehensive metabolic panel  4. Hyperglycemia  POCT glycosylated hemoglobin (Hb A1C)   Patient Instructions  Reduce the Ibuprofen 800 mg to THREE times daily.  Continue your efforts to quit smoking.  Your cough will improve with time.   If the remaining labs are negative, we'll plan to start a low-dose diuretic.  Discussed with Dr. Cleta Alberts.

## 2011-10-01 NOTE — Patient Instructions (Addendum)
Reduce the Ibuprofen 800 mg to THREE times daily.  Continue your efforts to quit smoking.  Your cough will improve with time.

## 2011-10-02 ENCOUNTER — Encounter: Payer: Self-pay | Admitting: Physician Assistant

## 2011-10-07 ENCOUNTER — Telehealth: Payer: Self-pay

## 2011-10-07 NOTE — Telephone Encounter (Signed)
Pt notified of labs. Says she takes a lot of Tylenol but will switch to Aleve or Advil. Pt wants to know if you still wanted to start her on a diuretic. Says she is still swollen and is going to the beach this weekend. Pt uses the [facility] on [address]. Thanks

## 2011-10-07 NOTE — Telephone Encounter (Signed)
Pt would like to know if her labs are back yet also pt states that she needs something for the fluid in her leg.

## 2011-10-08 MED ORDER — HYDROCHLOROTHIAZIDE 25 MG PO TABS: 12.5000 mg | ORAL_TABLET | Freq: Every day | ORAL | Status: DC

## 2011-10-08 NOTE — Telephone Encounter (Signed)
LMOM that Rx was called in. 

## 2011-11-10 ENCOUNTER — Other Ambulatory Visit: Payer: Self-pay | Admitting: Family Medicine

## 2011-11-13 ENCOUNTER — Other Ambulatory Visit: Payer: Self-pay | Admitting: Family Medicine

## 2011-11-15 ENCOUNTER — Other Ambulatory Visit: Payer: Self-pay | Admitting: Family Medicine

## 2011-12-16 ENCOUNTER — Other Ambulatory Visit: Payer: Self-pay | Admitting: Physician Assistant

## 2011-12-16 NOTE — Telephone Encounter (Signed)
Pt pf Daisy Tucker  She needs a refill for clonazapem and phenergan.  She only got half of it last time.  She is mostly concerned about the phenergan.   She would like this filled ASAP  Pharmacy [facility] on [address]  Best number [phone] for patient

## 2011-12-16 NOTE — Telephone Encounter (Signed)
Pt of Chelle   She needs a refill of clonazepam and phenergan.  Her stomach is very upset and she needs phenergan quickly.  Last time she only got half of her refill.  Pharmacy is [facility] on [address].  Patient best number is [phone]

## 2011-12-18 NOTE — Telephone Encounter (Signed)
Refilled both clonazepam and phenergan.

## 2012-01-13 ENCOUNTER — Other Ambulatory Visit: Payer: Self-pay | Admitting: Physician Assistant

## 2012-02-04 ENCOUNTER — Encounter: Payer: Self-pay | Admitting: Physician Assistant

## 2012-02-04 ENCOUNTER — Ambulatory Visit: Payer: Self-pay | Admitting: Physician Assistant

## 2012-02-04 VITALS — BP 128/86 | HR 126 | Temp 98.9°F | Resp 16 | Ht 69.0 in | Wt 241.2 lb

## 2012-02-04 DIAGNOSIS — Z124 Encounter for screening for malignant neoplasm of cervix: Secondary | ICD-10-CM

## 2012-02-04 DIAGNOSIS — Z0000 Encounter for general adult medical examination without abnormal findings: Secondary | ICD-10-CM

## 2012-02-04 DIAGNOSIS — F411 Generalized anxiety disorder: Secondary | ICD-10-CM | POA: Insufficient documentation

## 2012-02-04 DIAGNOSIS — B353 Tinea pedis: Secondary | ICD-10-CM

## 2012-02-04 DIAGNOSIS — R7989 Other specified abnormal findings of blood chemistry: Secondary | ICD-10-CM

## 2012-02-04 MED ORDER — PROMETHAZINE HCL 25 MG PO TABS: 25.0000 mg | ORAL_TABLET | Freq: Three times a day (TID) | ORAL | Status: DC | PRN

## 2012-02-04 MED ORDER — CLONAZEPAM 1 MG PO TABS: 1.0000 mg | ORAL_TABLET | Freq: Three times a day (TID) | ORAL | Status: DC | PRN

## 2012-02-04 NOTE — Progress Notes (Signed)
Subjective:    Patient ID: Daisy Tucker, female    DOB: 03-15-1976, 36 y.o.   MRN: [id number]  HPI AM…Daisy Tucker is a 36 y.o. female who presents for Annual Wellness Exam with gyn exam. G2 P1 Last pap test was >12 months ago and showed abnormal cells, and was prescribed a cream, but didn't use it.  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  Allergies  Allergen Reactions  . Asa Arthritis Strength-Antacid (Aspirin Buffered)   . Zofran    Prior to Admission medications   Medication Sig Start Date End Date Taking? Authorizing Provider  clonazePAM (KLONOPIN) 1 MG tablet TAKE ONE TABLET BY MOUTH THREE TIMES DAILY AS NEEDED FOR ANXIETY 01/13/12  Yes Morrell Riddle, PA-C  ibuprofen (ADVIL,MOTRIN) 800 MG tablet Take 800 mg by mouth 3 (three) times daily.    Yes Historical Provider, MD  promethazine (PHENERGAN) 25 MG tablet TAKE ONE TABLET BY MOUTH TWICE DAILY AS NEEDED FOR NAUSEA 01/13/12  Yes Morrell Riddle, PA-C  DULoxetine (CYMBALTA) 60 MG capsule Take 60 mg by mouth daily.    Historical Provider, MD  hydrochlorothiazide (HYDRODIURIL) 25 MG tablet Take 0.5-1 tablets (12.5-25 mg total) by mouth daily. 10/08/11 10/07/12  Fernande Bras, PA-C    Past Medical History  Diagnosis Date  . Anxiety   . Miscarriage     Rh negative  . Nausea    Past Surgical History  Procedure Date  . Spine surgery 2008    L4-5 disc reconstruction   History   Social History  . Marital Status: Married    Spouse Name: Gaylyn Rong    Number of Children: 1  . Years of Education: 13   Occupational History  . self-employed     English as a second language teacher   Social History Main Topics  . Smoking status: Current Every Day Smoker -- 1.0 packs/day    Types: Cigarettes  . Smokeless tobacco: None   Comment: cutting back, down to <.5 ppd, some days only 2 cigarettes  . Alcohol Use: No  . Drug Use: Yes    Special: Marijuana  . Sexually Active: Yes -- Female partner(s)    Birth Control/ Protection: None     desires pregnancy    Other Topics Concern  . None   Social History Narrative   Married Gaylyn Rong 12/24/2007. His son lives with his mother.Daughter, Daisy Tucker, born 03/22/2004 from a previous relationship, lives with them.   Family History  Problem Relation Age of Onset  . Stroke Father     x 3  . Alcohol abuse Mother    Review of Systems  Constitutional: Negative.   HENT: Negative.   Eyes: Negative.   Respiratory: Negative.   Cardiovascular: Negative.   Gastrointestinal: Positive for nausea (phenergan BID is adequate.  PPI and H2 blockers have not been helpful.  Experiences regurgitation, especially with high fat foods.). Negative for vomiting, abdominal pain, diarrhea, constipation, blood in stool, abdominal distention, anal bleeding and rectal pain.  Genitourinary: Negative for dysuria, urgency, frequency, hematuria, flank pain, decreased urine volume, vaginal discharge, enuresis, difficulty urinating, genital sores, vaginal pain and dyspareunia. Vaginal bleeding: currently menstruating. Menstrual problem: currently menstruating; having cramps. Pelvic pain: heavy periods with large clots.  Musculoskeletal: Negative for myalgias, back pain, joint swelling, arthralgias and gait problem.       Has noticed a non-tender lump on the top of the right foot x 2-3 months.  Skin: Negative.   Neurological: Negative.   Hematological: Negative.   Psychiatric/Behavioral: Negative  for suicidal ideas, hallucinations, behavioral problems, confusion, disturbed wake/sleep cycle, self-injury, dysphoric mood, decreased concentration and agitation. The patient is not hyperactive. Nervous/anxious: controlled with clonazepam TID.        Objective:   Physical Exam  Vitals reviewed. Constitutional: She is oriented to person, place, and time. Vital signs are normal. She appears well-developed and well-nourished. No distress.  HENT:  Head: Normocephalic and atraumatic.  Right Ear: Hearing, tympanic membrane, external ear and ear  canal normal. No foreign bodies.  Left Ear: Hearing, tympanic membrane, external ear and ear canal normal. No foreign bodies.  Nose: Nose normal.  Mouth/Throat: Uvula is midline, oropharynx is clear and moist and mucous membranes are normal. No oral lesions. Normal dentition. No dental abscesses or uvula swelling. No oropharyngeal exudate.  Eyes: Conjunctivae normal and EOM are normal. Pupils are equal, round, and reactive to light. Right eye exhibits no discharge. Left eye exhibits no discharge. No scleral icterus.  Fundoscopic exam:      The right eye shows no arteriolar narrowing, no AV nicking, no exudate, no hemorrhage and no papilledema. The right eye shows red reflex.The right eye shows no venous pulsations.      The left eye shows no arteriolar narrowing, no AV nicking, no exudate, no hemorrhage and no papilledema. The left eye shows red reflex.The left eye shows no venous pulsations. Neck: Trachea normal, normal range of motion and full passive range of motion without pain. Neck supple. No spinous process tenderness and no muscular tenderness present. No mass and no thyromegaly present.  Cardiovascular: Normal rate, regular rhythm, normal heart sounds, intact distal pulses and normal pulses.   Pulmonary/Chest: Effort normal and breath sounds normal. She exhibits no tenderness and no retraction. Right breast exhibits no inverted nipple, no mass, no nipple discharge, no skin change and no tenderness. Left breast exhibits no inverted nipple, no mass, no nipple discharge, no skin change and no tenderness. Breasts are symmetrical.  Abdominal: Soft. Normal appearance and bowel sounds are normal. She exhibits no distension and no mass. There is no hepatosplenomegaly. There is no tenderness. There is no rigidity, no rebound, no guarding, no CVA tenderness, no tenderness at McBurney's point and negative Murphy's sign. No hernia. Hernia confirmed negative in the right inguinal area and confirmed negative  in the left inguinal area.  Genitourinary: Rectum normal, vagina normal and uterus normal. Rectal exam shows no external hemorrhoid, no internal hemorrhoid, no fissure, no mass, no tenderness and anal tone normal. No breast swelling, tenderness, discharge or bleeding. Pelvic exam was performed with patient supine. No labial fusion. There is no rash, tenderness, lesion or injury on the right labia. There is no rash, tenderness, lesion or injury on the left labia. Cervix exhibits no motion tenderness, no discharge and no friability. Right adnexum displays no mass, no tenderness and no fullness. Left adnexum displays no mass, no tenderness and no fullness. No erythema, tenderness or bleeding around the vagina. No foreign body around the vagina. No signs of injury around the vagina. No vaginal discharge found.  Musculoskeletal: She exhibits no edema and no tenderness.       Cervical back: Normal.       Thoracic back: Normal.       Lumbar back: Normal.       Feet:  Lymphadenopathy:       Head (right side): No tonsillar, no preauricular, no posterior auricular and no occipital adenopathy present.       Head (left side): No tonsillar, no preauricular,  no posterior auricular and no occipital adenopathy present.    She has no cervical adenopathy.    She has no axillary adenopathy.       Right: No inguinal and no supraclavicular adenopathy present.       Left: No inguinal and no supraclavicular adenopathy present.  Neurological: She is alert and oriented to person, place, and time. She has normal strength and normal reflexes. No cranial nerve deficit. She exhibits normal muscle tone. Coordination and gait normal.  Skin: Skin is warm, dry and intact. Rash noted. Rash is papular (peeling skin and satellite lesions consistent with tinea on both feet.). She is not diaphoretic. No cyanosis or erythema. Nails show no clubbing.  Psychiatric: She has a normal mood and affect. Her speech is normal and behavior is  normal. Judgment and thought content normal.      Assessment & Plan:   1. Routine general medical examination at a health care facility  Anticipatory guidance provided  2. GAD (generalized anxiety disorder)  Refilled clonazepam  3. Screening for cervical cancer  Pap IG and HPV (high risk) DNA detection  4. Elevated LFTs  ALT, AST  5. Tinea pedis  Apply Lamisil to feet twice daily   Get a FLU SHOT!

## 2012-02-04 NOTE — Patient Instructions (Signed)
Apply Lamisil to your feet twice daily.  Clean the exfoliator daily.  Get a FLU SHOT!  Keeping You Healthy  Get These Tests 1. Blood Pressure- Have your blood pressure checked once a year by your health care provider.  Normal blood pressure is 120/80. 2. Weight- Have your body mass index (BMI) calculated to screen for obesity.  BMI is measure of body fat based on height and weight.  You can also calculate your own BMI at https://www.west-esparza.com/. 3. Cholesterol- Have your cholesterol checked every 5 years starting at age 4 then yearly starting at age 64. 4. Chlamydia, HIV, and other sexually transmitted diseases- Get screened every year until age 59, then within three months of each new sexual provider. 5. Pap Smear- Every 1-3 years; discuss with your health care provider. 6. Mammogram- Every year starting at age 74  Take these medicines  Calcium with Vitamin D-Your body needs 1200 mg of Calcium each day and [phone] IU of Vitamin D daily.  Your body can only absorb 500 mg of Calcium at a time so Calcium must be taken in 2 or 3 divided doses throughout the day.  Multivitamin with folic acid- Once daily if it is possible for you to become pregnant.  Get these Immunizations  Gardasil-Series of three doses; prevents HPV related illness such as genital warts and cervical cancer.  Menactra-Single dose; prevents meningitis.  Tetanus shot- Every 10 years.  Flu shot-Every year.  Take these steps 1. Do not smoke-Your healthcare provider can help you quit.  For tips on how to quit go to www.smokefree.gov or call 1-800 QUITNOW. 2. Be physically active- Exercise 5 days a week for at least 30 minutes.  If you are not already physically active, start slow and gradually work up to 30 minutes of moderate physical activity.  Examples of moderate activity include walking briskly, dancing, swimming, bicycling, etc. 3. Breast Cancer- A self breast exam every month is important for early detection of  breast cancer.  For more information and instruction on self breast exams, ask your healthcare provider or [email or web address].[email or web address]. 4. Eat a healthy diet- Eat a variety of healthy foods such as fruits, vegetables, whole grains, low fat milk, low fat cheeses, yogurt, lean meats, poultry and fish, beans, nuts, tofu, etc.  For more information go to www. Thenutritionsource.org 5. Drink alcohol in moderation- Limit alcohol intake to one drink or less per day. Never drink and drive. 6. Depression- Your emotional health is as important as your physical health.  If you're feeling down or losing interest in things you normally enjoy please talk to your healthcare provider about being screened for depression. 7. Dental visit- Brush and floss your teeth twice daily; visit your dentist twice a year. 8. Eye doctor- Get an eye exam at least every 2 years. 9. Helmet use- Always wear a helmet when riding a bicycle, motorcycle, rollerblading or skateboarding. 10. Safe sex- If you may be exposed to sexually transmitted infections, use a condom. 11. Seat belts- Seat belts can save your live; always wear one. 12. Smoke/Carbon Monoxide detectors- These detectors need to be installed on the appropriate level of your home. Replace batteries at least once a year. 13. Skin cancer- When out in the sun please cover up and use sunscreen 15 SPF or higher. 14. Violence- If anyone is threatening or hurting you, please tell your healthcare provider.

## 2012-02-05 ENCOUNTER — Telehealth: Payer: Self-pay

## 2012-02-05 LAB — AST: AST: 35 U/L (ref 0–37)

## 2012-02-05 LAB — ALT: ALT: 34 U/L (ref 0–35)

## 2012-02-05 NOTE — Telephone Encounter (Signed)
The patient called again regarding her Klonopin rx.  The patient was tearful on the phone and stated " I have been three days without my benzos and I don't know what I am supposed to do if I can't get my medication."  Please call the patient at [phone] to discuss her rx.

## 2012-02-05 NOTE — Telephone Encounter (Signed)
YES! It is OKAY to fill this early!

## 2012-02-05 NOTE — Telephone Encounter (Signed)
PT WAS WRITTEN A SCRIPT FOR KLONOPIN EIGHT DAYS EARLY BY CHELLE.  SHE SAID CHELLE DID THIS INTENTIONALLY.  WHEN SHE TOOK THE SCRIPT TO [facility] ON [address], THEY WOULD NOT FILL IT.  PHARMACY SAYS CHELLE HAS TO CALL THEM.  [employer] AT [phone]

## 2012-02-05 NOTE — Telephone Encounter (Signed)
Pharmacy called to check to see if they should fill Rx for clonazepam for pt written 02/04/12. Pt got last RF on 01/16/12 so they can not fill until 02/13/12. Pt is claiming that Chelle knows that it is early and they should fill it. Chelle, do you want pharm to fill now or wait until 9/28? I didn't see anything in the OV notes that suggested that you intended for pt to get the RF early.

## 2012-02-05 NOTE — Telephone Encounter (Signed)
Called and spoke w/pharmacist to advise that Chelle would like them to fill Rx now. Pharmacist agreed. Called and notified pt who was very appreciative.

## 2012-02-08 LAB — PAP IG AND HPV HIGH-RISK: HPV DNA High Risk: NOT DETECTED

## 2012-02-09 ENCOUNTER — Encounter: Payer: Self-pay | Admitting: Physician Assistant

## 2012-03-07 ENCOUNTER — Other Ambulatory Visit: Payer: Self-pay | Admitting: Physician Assistant

## 2012-04-04 ENCOUNTER — Telehealth: Payer: Self-pay | Admitting: *Deleted

## 2012-04-04 NOTE — Telephone Encounter (Signed)
Pharmacy requesting refill on clonazepam 1mg  1 tid prn anxiety.  Last filled 03/08/12

## 2012-04-05 MED ORDER — CLONAZEPAM 1 MG PO TABS: 1.0000 mg | ORAL_TABLET | Freq: Three times a day (TID) | ORAL | Status: DC | PRN

## 2012-04-05 NOTE — Telephone Encounter (Signed)
Rx called in to pharmacy. 

## 2012-04-05 NOTE — Telephone Encounter (Signed)
Rx printed

## 2012-04-11 ENCOUNTER — Other Ambulatory Visit: Payer: Self-pay | Admitting: Radiology

## 2012-04-11 MED ORDER — PROMETHAZINE HCL 25 MG PO TABS: 25.0000 mg | ORAL_TABLET | Freq: Three times a day (TID) | ORAL | Status: DC | PRN

## 2012-04-11 NOTE — Telephone Encounter (Signed)
Patient wants Rx for phenergan, she states she vomits daily. And is going out of town uses [address] on [address]. Patient very tearful, states she must have this medication. I pended, same as previous Rx that was given last month. Patient states she is unable to see GI doctor b/c she has no insurance.

## 2012-05-07 ENCOUNTER — Other Ambulatory Visit: Payer: Self-pay | Admitting: Physician Assistant

## 2012-06-03 ENCOUNTER — Other Ambulatory Visit: Payer: Self-pay | Admitting: Physician Assistant

## 2012-06-08 ENCOUNTER — Telehealth: Payer: Self-pay | Admitting: *Deleted

## 2012-06-08 NOTE — Telephone Encounter (Signed)
Pharmacy requesting refill on klonopin 1mg . Last fill 05/09/12

## 2012-06-09 ENCOUNTER — Telehealth: Payer: Self-pay

## 2012-06-09 ENCOUNTER — Other Ambulatory Visit: Payer: Self-pay | Admitting: Physician Assistant

## 2012-06-09 MED ORDER — PROMETHAZINE HCL 25 MG PO TABS: 25.0000 mg | ORAL_TABLET | Freq: Three times a day (TID) | ORAL | Status: DC | PRN

## 2012-06-09 MED ORDER — CLONAZEPAM 1 MG PO TABS: 1.0000 mg | ORAL_TABLET | Freq: Three times a day (TID) | ORAL | Status: DC | PRN

## 2012-06-09 NOTE — Telephone Encounter (Signed)
Faxed

## 2012-06-09 NOTE — Telephone Encounter (Signed)
Refill requests  promethazine (PHENERGAN) 25 MG tablet clonazePAM (KLONOPIN) 1 MG tablet   CBN:  [phone]

## 2012-06-09 NOTE — Telephone Encounter (Signed)
This patient doesn't see a provider regularly, didn't know who to route it to.

## 2012-06-09 NOTE — Telephone Encounter (Signed)
At Tl desk 

## 2012-06-09 NOTE — Telephone Encounter (Deleted)
WAS SEEN [facility] A WEEK AGO

## 2012-06-09 NOTE — Telephone Encounter (Signed)
CALLING [facility]. ONE RX WAS FILLED BUT THE OTHER HAS NOT BEEN. PHARMACY SAID THEY ARE WAITING ON AUTHORIZATION BY A PHYSICIAN.   [phone]

## 2012-06-10 NOTE — Telephone Encounter (Signed)
Called in RX to [facility]. Pt notified.

## 2012-07-07 ENCOUNTER — Other Ambulatory Visit: Payer: Self-pay | Admitting: Physician Assistant

## 2012-08-04 ENCOUNTER — Other Ambulatory Visit: Payer: Self-pay | Admitting: Physician Assistant

## 2012-08-06 ENCOUNTER — Telehealth: Payer: Self-pay

## 2012-08-06 NOTE — Telephone Encounter (Signed)
PT WOULD LIKE A REFILL ON CLONAZEPAM; PT STATES THAT SHE IS COMPLETELY OUT! PT IS UNABLE TO COME IN UNTIL HER APPT UNTIL THE 8TH OF April, 2014. BEST# [phone]

## 2012-08-07 ENCOUNTER — Ambulatory Visit: Payer: Self-pay | Admitting: Family Medicine

## 2012-08-07 VITALS — BP 131/95 | HR 112 | Temp 98.7°F | Resp 18 | Ht 70.5 in | Wt 214.0 lb

## 2012-08-07 DIAGNOSIS — M7989 Other specified soft tissue disorders: Secondary | ICD-10-CM

## 2012-08-07 DIAGNOSIS — F411 Generalized anxiety disorder: Secondary | ICD-10-CM

## 2012-08-07 DIAGNOSIS — M545 Low back pain, unspecified: Secondary | ICD-10-CM

## 2012-08-07 MED ORDER — GABAPENTIN 300 MG PO CAPS: 300.0000 mg | ORAL_CAPSULE | Freq: Every evening | ORAL | Status: DC | PRN

## 2012-08-07 MED ORDER — CLONAZEPAM 1 MG PO TABS: 1.0000 mg | ORAL_TABLET | Freq: Three times a day (TID) | ORAL | Status: DC | PRN

## 2012-08-07 NOTE — Progress Notes (Signed)
Subjective:    Patient ID: Daisy Tucker, female    DOB: 14-Nov-1975, 37 y.o.   MRN: [id number]  HPI Daisy Tucker is a 37 y.o. female Here with multiple concerns.  Anxiety/GAD - last ov in 01/2012 for CPE. appt with Chelle - primary provider on April 8th, but plans on cancelling as here tonight. Needs refill of Klonopin - takes Klonopin 1mg  TID, same dose for 10 years. No other daily meds - due to different "odd" reactions with SSRI's, including suicidal thoughts with Zoloft. No current counselor, last visit 3 months ago.  Mom passed away few years ago - depressed about this at times, but no antidepressant - feels like counseling helps when needed. Dr. Carver Fila - but no meds prescribed.  Last Klonopin taken yesterday.  Filled 07/07/12.   R leg swelling  For past approx 1 year,  Has lost 20 pounds since last ov with Chelle.  Swelling going down some, but now came back   Took hctz for 1 month last year  - but did not help. No pain in calf.  No chest pain/ dyspnea. Foot was swollen in past, but not now. Noticed a bump in top of R foot when swelling resolved. No pain in bump. This was noted in 9/13 at physical.  Hx of back surgery in 2009, had been prescribed neurontin, would like to be prescribed this again as having pain going from lower back to R buttocks and upper thigh. L4-L5 surgery and metal rod? From "shattered back"   Takes phenergan, but has not seen GI yet.   May be lining up insurance next few months.  Review of Systems  Respiratory: Negative for chest tightness and shortness of breath.   Cardiovascular: Positive for leg swelling. Negative for chest pain.  Musculoskeletal: Positive for back pain and arthralgias.  Psychiatric/Behavioral: Negative for suicidal ideas.       Objective:   Physical Exam  Constitutional: She appears well-developed and well-nourished. No distress.  Pulmonary/Chest: Effort normal.  Abdominal: There is no tenderness. Hernia confirmed negative in the  right inguinal area and confirmed negative in the left inguinal area.  Musculoskeletal:       Back:       Right lower leg: She exhibits swelling. She exhibits no tenderness.       Legs: Neurological: She is alert. She has normal strength.  Reflex Scores:      Patellar reflexes are 2+ on the right side and 2+ on the left side.      Achilles reflexes are 2+ on the right side and 2+ on the left side. Skin: No rash noted.  Psychiatric: She has a normal mood and affect. Her behavior is normal. Thought content normal.       Assessment & Plan:  Daisy Tucker is a 37 y.o. female GAD (generalized anxiety disorder) - Plan: clonazePAM (KLONOPIN) 1 MG tablet - discussed other mgt options above, but intolerant to SSRi's by report, and no recent counseling.  Refilled klonopin #90 for next 1 month, but discussed controlled substance policy below. initially scheduled for follow up with PCP on April 8th, but plans on cancelling this appt as was seen in office tonight. Will discuss with her primary provider about refills and necessary timing of follow up, as 3 months of  prescription was requested prior to next follow up. .   Low back pain radiating to right leg - Plan: gabapentin (NEURONTIN) 300 MG capsule - trial at night as requested as had  taken prior, but discussed trial of piriformis stretches as demonstrated in office.   Leg swelling - R sided.  Longstanding by hx, and improved with weight loss. Discussed other possible workup as below, given asymmetry, but this can also be discussed with her primary provider on necessary workup or treatment options.  rtc precautions given.   Meds ordered this encounter  Medications  . clonazePAM (KLONOPIN) 1 MG tablet    Sig: Take 1 tablet (1 mg total) by mouth 3 (three) times daily as needed for anxiety.    Dispense:  90 tablet    Refill:  0  . gabapentin (NEURONTIN) 300 MG capsule    Sig: Take 1 capsule (300 mg total) by mouth at bedtime as needed.     Dispense:  90 capsule    Refill:  0     Patient Instructions  You can start the neurontin at bedtime initially for your low back pain, but work on the piriformis stretches as discussed in the office, and recheck with your primary provider in the next 3 months. Be careful combining this medicine with your Klonopin.  For your leg swelling, this could be from multiple causes, and would recommend further testing including possible ultrasound or vascular testing, possible abdominal or groin xrays/cat scans and possible vascualr specialist evaluation.  Let us know when you would like [place] to arrange some of this workup, but it can also be discussed in follow up with your primary provider. Return to the clinic or go to the nearest emergency room if any of your symptoms worsen or new symptoms occur. Elevate legs when seated, as this may help swelling.  I can refill the Klonopin today, but will need to discuss with your primary provider about refills. In the future, these prescriptions will need to be written by your primary provider as discussed.  [facility] Policy for Prescribing Controlled Substances (Revised 03/2012) 1. Prescriptions for controlled substances will be filled by ONE provider at [facility] with whom you have established and developed a plan for your care, including follow-up. 2. You are encouraged to schedule an appointment with your prescriber at our appointment center for follow-up visits whenever possible. 3. If you request a prescription for the controlled substance while at [facility] for an acute problem (with someone other than your regular prescriber), you MAY be given a ONE-TIME prescription for a 30-day supply of the controlled substance, to allow time for you to return to see your regular prescriber for additional prescriptions.

## 2012-08-07 NOTE — Patient Instructions (Addendum)
You can start the neurontin at bedtime initially for your low back pain, but work on the piriformis stretches as discussed in the office, and recheck with your primary provider in the next 3 months. Be careful combining this medicine with your Klonopin.  For your leg swelling, this could be from multiple causes, and would recommend further testing including possible ultrasound or vascular testing, possible abdominal or groin xrays/cat scans and possible vascualr specialist evaluation.  Let us know when you would like [place] to arrange some of this workup, but it can also be discussed in follow up with your primary provider. Return to the clinic or go to the nearest emergency room if any of your symptoms worsen or new symptoms occur. Elevate legs when seated, as this may help swelling.  I can refill the Klonopin today, but will need to discuss with your primary provider about refills. In the future, these prescriptions will need to be written by your primary provider as discussed.  [facility] Policy for Prescribing Controlled Substances (Revised 03/2012) 1. Prescriptions for controlled substances will be filled by ONE provider at [facility] with whom you have established and developed a plan for your care, including follow-up. 2. You are encouraged to schedule an appointment with your prescriber at our appointment center for follow-up visits whenever possible. 3. If you request a prescription for the controlled substance while at [facility] for an acute problem (with someone other than your regular prescriber), you MAY be given a ONE-TIME prescription for a 30-day supply of the controlled substance, to allow time for you to return to see your regular prescriber for additional prescriptions.

## 2012-08-12 NOTE — Telephone Encounter (Signed)
The patient came in and was seen by Dr. Neva Seat on 08/07/12.

## 2012-08-25 ENCOUNTER — Ambulatory Visit: Payer: Self-pay | Admitting: Physician Assistant

## 2012-09-05 ENCOUNTER — Other Ambulatory Visit: Payer: Self-pay | Admitting: Family Medicine

## 2012-09-05 ENCOUNTER — Other Ambulatory Visit: Payer: Self-pay | Admitting: Physician Assistant

## 2012-09-06 NOTE — Telephone Encounter (Signed)
Spoke w/pt to check on her plan for f/up. Note had said that pt had appt for 08/23/12. Pt stated when she was in to see Dr Neva Seat he had explained the new cont subst policy and that she would need to see Chelle for RFs. Pt stated that it was discussed that she would f/up w/Chelle in 3 mos instead of 08/23/12 since she is self-pay and she is saving for some other procedures also. Pt requests that Chelle RF her Rxs until appt. I transferred pt to 104 to set up appt.

## 2012-09-06 NOTE — Telephone Encounter (Signed)
Spoke w/pt to check on her plan for f/up. Note had said that pt had appt for 08/23/12. Pt stated when she was in to see Dr Greene he had explained the new cont subst policy and that she would need to see Chelle for RFs. Pt stated that it was discussed that she would f/up w/Chelle in 3 mos instead of 08/23/12 since she is self-pay and she is saving for some other procedures also. Pt requests that Chelle RF her Rxs until appt. I transferred pt to 104 to set up appt.  

## 2012-09-07 NOTE — Telephone Encounter (Signed)
As discussed with her last month by Dr. Neva Seat, she needs to come in to see me and discuss this. Since there was apparently some mis-communication, about 1 month vs. 3 months, I'm willing to authorize a 1 month supply, but she'll need to see me to discuss additional refills beyond that. I will print it tomorrow, or it can be called in today.  Below is copied from her last visit (08/07/2012) with Dr. Neva Seat.  GAD (generalized anxiety disorder) - Plan: clonazePAM (KLONOPIN) 1 MG tablet - discussed other mgt options above, but intolerant to SSRi's by report, and no recent counseling. Refilled klonopin #90 for next 1 month, but discussed controlled substance policy below. initially scheduled for follow up with PCP on April 8th, but plans on cancelling this appt as was seen in office tonight. Will discuss with her primary provider about refills and necessary timing of follow up, as 3 months of prescription was requested prior to next follow up  I can refill the Klonopin today, but will need to discuss with your primary provider about refills. In the future, these prescriptions will need to be written by your primary provider as discussed.  [facility] Policy for Prescribing Controlled Substances (Revised 03/2012)  1. Prescriptions for controlled substances will be filled by ONE provider at [facility] with whom you have established and developed a plan for your care, including follow-up. 2. You are encouraged to schedule an appointment with your prescriber at our appointment center for follow-up visits whenever possible. 3. If you request a prescription for the controlled substance while at [facility] for an acute problem (with someone other than your regular prescriber), you MAY be given a ONE-TIME prescription for a 30-day supply of the controlled substance, to allow time for you to return to see your regular prescriber for additional prescriptions. 4.

## 2012-09-07 NOTE — Telephone Encounter (Signed)
Called in Rx and notified pt that she will need to RTC w/in the mos, not in June as scheduled. Pt agreed and I transferred her to 104 to change appt.

## 2012-09-21 ENCOUNTER — Encounter (HOSPITAL_COMMUNITY): Payer: Self-pay | Admitting: Emergency Medicine

## 2012-09-21 ENCOUNTER — Emergency Department (HOSPITAL_COMMUNITY)
Admission: EM | Admit: 2012-09-21 | Discharge: 2012-09-21 | Disposition: A | Discharge status: Home / Self Care | Payer: Self-pay | Attending: Emergency Medicine | Admitting: Emergency Medicine

## 2012-09-21 ENCOUNTER — Emergency Department (HOSPITAL_COMMUNITY): Payer: Self-pay

## 2012-09-21 DIAGNOSIS — F17200 Nicotine dependence, unspecified, uncomplicated: Secondary | ICD-10-CM | POA: Insufficient documentation

## 2012-09-21 DIAGNOSIS — R259 Unspecified abnormal involuntary movements: Secondary | ICD-10-CM | POA: Insufficient documentation

## 2012-09-21 DIAGNOSIS — F1910 Other psychoactive substance abuse, uncomplicated: Secondary | ICD-10-CM | POA: Insufficient documentation

## 2012-09-21 DIAGNOSIS — F1010 Alcohol abuse, uncomplicated: Secondary | ICD-10-CM | POA: Insufficient documentation

## 2012-09-21 DIAGNOSIS — F10929 Alcohol use, unspecified with intoxication, unspecified: Secondary | ICD-10-CM

## 2012-09-21 DIAGNOSIS — Z791 Long term (current) use of non-steroidal anti-inflammatories (NSAID): Secondary | ICD-10-CM | POA: Insufficient documentation

## 2012-09-21 DIAGNOSIS — Z3202 Encounter for pregnancy test, result negative: Secondary | ICD-10-CM | POA: Insufficient documentation

## 2012-09-21 DIAGNOSIS — R4789 Other speech disturbances: Secondary | ICD-10-CM | POA: Insufficient documentation

## 2012-09-21 DIAGNOSIS — F411 Generalized anxiety disorder: Secondary | ICD-10-CM | POA: Insufficient documentation

## 2012-09-21 LAB — CBC
HCT: 45.7 % (ref 36.0–46.0)
Hemoglobin: 16.4 g/dL — ABNORMAL HIGH (ref 12.0–15.0)
MCH: 31.6 pg (ref 26.0–34.0)
MCHC: 35.9 g/dL (ref 30.0–36.0)
MCV: 88.1 fL (ref 78.0–100.0)
Platelets: 265 10*3/uL (ref 150–400)
RBC: 5.19 MIL/uL — ABNORMAL HIGH (ref 3.87–5.11)
RDW: 12.9 % (ref 11.5–15.5)
WBC: 8.7 10*3/uL (ref 4.0–10.5)

## 2012-09-21 LAB — COMPREHENSIVE METABOLIC PANEL
ALT: 35 U/L (ref 0–35)
AST: 68 U/L — ABNORMAL HIGH (ref 0–37)
Albumin: 4.1 g/dL (ref 3.5–5.2)
Alkaline Phosphatase: 46 U/L (ref 39–117)
BUN: 8 mg/dL (ref 6–23)
CO2: 21 mEq/L (ref 19–32)
Calcium: 9.2 mg/dL (ref 8.4–10.5)
Chloride: 105 mEq/L (ref 96–112)
Creatinine, Ser: 0.65 mg/dL (ref 0.50–1.10)
GFR calc Af Amer: >90 mL/min (ref >90)
GFR calc non Af Amer: >90 mL/min (ref >90)
Glucose, Bld: 100 mg/dL — ABNORMAL HIGH (ref 70–99)
Potassium: 6.1 mEq/L — ABNORMAL HIGH (ref 3.5–5.1)
Sodium: 138 mEq/L (ref 135–145)
Total Bilirubin: 0.3 mg/dL (ref 0.3–1.2)
Total Protein: 8.3 g/dL (ref 6.0–8.3)

## 2012-09-21 LAB — RAPID URINE DRUG SCREEN, HOSP PERFORMED
Amphetamines: NOT DETECTED
Barbiturates: NOT DETECTED
Benzodiazepines: NOT DETECTED
Cocaine: NOT DETECTED
Opiates: NOT DETECTED
Tetrahydrocannabinol: NOT DETECTED

## 2012-09-21 LAB — SALICYLATE LEVEL: Salicylate Lvl: <2 mg/dL — ABNORMAL LOW (ref 2.8–20.0)

## 2012-09-21 LAB — PREGNANCY, URINE: Preg Test, Ur: NEGATIVE

## 2012-09-21 LAB — ACETAMINOPHEN LEVEL: Acetaminophen (Tylenol), Serum: <15 ug/mL (ref 10–30)

## 2012-09-21 NOTE — ED Notes (Signed)
Pt appears very tearful and anxious.  Pt runs through activities throughout day.  Pt admits to drinking 5 shots of vodka today.  Denies drug use but states "I dont think so".  Pt states "[employer] came to fix the internet and there was a lot of people in the yard and one came in the house"  When asked what happened when the one came in the house the patient became very tearful, shaking head and declining to answer any other questions.  When asked if anything happened in her house she just cries and shakes her head and declines to answer but then will eventually respond no and request husband at bedside.

## 2012-09-21 NOTE — ED Provider Notes (Signed)
History     CSN: [id number]  Arrival date & time 09/21/12  1906   First MD Initiated Contact with Patient 09/21/12 2005      Chief Complaint  Patient presents with  . Medical Clearance    (Consider location/radiation/quality/duration/timing/severity/associated sxs/prior treatment) HPI Comments: Daisy Tucker is a 37 y.o. female with a history of anxiety and passed substance abuse presents emergency department after her husband called 911 finding his wife in disarray.  Report is obtained from both husband and patient.  Patient does not recall events after 2 p.m. this afternoon.  Husband reports that he arrived work around 6 p.m. and found wife crying with the house looking as if it was trampled through. He reports that his wife had an episode of shaking on the floor that lasted about 30 sec w slurred speech.  There was no bowel or bladder incontinence.  Eyes were closed.  Patient did not have a history of seizures in past.  Husband reports he has seen his wife on every drug in the book, however this is extremely abnormal behavior.  Patient states that she feels like she is on a hallucinogen.  She reports that multiple people were in front yard changing water pipes and there were people in the house as well.  Husband states that this could be true as they got a letter that something like this was happening however he was unsure of when this was going to happen. Pt denies SI/HI.   The history is provided by the patient.    Past Medical History  Diagnosis Date  . Anxiety   . Miscarriage     Rh negative  . Nausea     Past Surgical History  Procedure Laterality Date  . Spine surgery  2008    L4-5 disc reconstruction    Family History  Problem Relation Age of Onset  . Stroke Father     x 3  . Alcohol abuse Mother     History  Substance Use Topics  . Smoking status: Current Every Day Smoker -- 1.00 packs/day    Types: Cigarettes  . Smokeless tobacco: Not on file     Comment:  cutting back, down to <.5 ppd, some days only 2 cigarettes  . Alcohol Use: No    OB History   Grav Para Term Preterm Abortions TAB SAB Ect Mult Living   2 1 1  0 1 1 0 0 0 1      Review of Systems  All other systems reviewed and are negative.    Allergies  Asa arthritis strength-antacid and Zofran  Home Medications   Current Outpatient Rx  Name  Route  Sig  Dispense  Refill  . clonazePAM (KLONOPIN) 1 MG tablet      TAKE ONE TABLET BY MOUTH THREE TIMES DAILY AS NEEDED FOR ANXIETY   90 tablet   0   . gabapentin (NEURONTIN) 300 MG capsule   Oral   Take 1 capsule (300 mg total) by mouth at bedtime as needed.   90 capsule   0   . ibuprofen (ADVIL,MOTRIN) 800 MG tablet   Oral   Take 800 mg by mouth 3 (three) times daily.          . promethazine (PHENERGAN) 25 MG tablet      TAKE ONE TABLET BY MOUTH EVERY 8 HOURS AS NEEDED FOR NAUSEA   60 tablet   0     BP 119/87  Pulse 101  Temp(Src) 98.8  F (37.1 C) (Oral)  Resp 20  SpO2 97%  LMP 09/13/2012  Physical Exam  Constitutional: She is oriented to person, place, and time. She appears well-developed and well-nourished. No distress.  HENT:  Head: Normocephalic and atraumatic.  Mouth/Throat: Oropharynx is clear and moist. No oropharyngeal exudate.  Eyes: Conjunctivae and EOM are normal. Pupils are equal, round, and reactive to light. No scleral icterus.  Mild anisocoria, R slightly > L vs Right tonic pupil.   Neck: Normal range of motion. Neck supple. No tracheal deviation present. No thyromegaly present.  Cardiovascular: Normal rate, regular rhythm, normal heart sounds and intact distal pulses.   Pulmonary/Chest: Effort normal and breath sounds normal. No stridor. No respiratory distress. She has no wheezes.  Abdominal: Soft.  Musculoskeletal: Normal range of motion. She exhibits no edema and no tenderness.  Neurological: She is alert and oriented to person, place, and time.  Good coordination.  Cranial nerves  intact. Normal ambulation without difficulty.  Strength 5/5 bilaterally.  Skin: Skin is warm and dry. No rash noted. She is not diaphoretic. No erythema. No pallor.  Psychiatric: Thought content is not paranoid and not delusional. She expresses no homicidal and no suicidal ideation. She expresses no suicidal plans. She exhibits abnormal recent memory.  Tearful     ED Course  Procedures (including critical care time)  Labs Reviewed  CBC - Abnormal; Notable for the following:    RBC 5.19 (*)    Hemoglobin 16.4 (*)    All other components within normal limits  COMPREHENSIVE METABOLIC PANEL - Abnormal; Notable for the following:    Potassium 6.1 (*)    Glucose, Bld 100 (*)    AST 68 (*)    All other components within normal limits  ETHANOL - Abnormal; Notable for the following:    Alcohol, Ethyl (B) 264 (*)    All other components within normal limits  SALICYLATE LEVEL - Abnormal; Notable for the following:    Salicylate Lvl <2.0 (*)    All other components within normal limits  URINE RAPID DRUG SCREEN (HOSP PERFORMED)  PREGNANCY, URINE  ACETAMINOPHEN LEVEL   Ct Head Wo Contrast  09/21/2012  *RADIOLOGY REPORT*  Clinical Data: Medical clearance, found on floor  CT HEAD WITHOUT CONTRAST  Technique:  Contiguous axial images were obtained from the base of the skull through the vertex without contrast.  Comparison: None.  Findings: No acute intracranial hemorrhage.  No focal mass lesion. No CT evidence of acute infarction.   No midline shift or mass effect.  No hydrocephalus.  Basilar cisterns are patent.  Paranasal sinuses and mastoid air cells are clear.  Orbits are normal.  IMPRESSION: Normal head CT   Original Report Authenticated By: Genevive Bi, M.D.    Labs and imaging reviewed with potassium of 6.1 likely due to hemolysis.  ECGs ordered without peak T waves.  Patient refuses additional blood stick.  No diagnosis found.   Date: 09/21/2012  Rate: 113  Rhythm: sinus tachy   QRS  Axis: normal  Intervals: normal  ST/T Wave abnormalities: normal  Conduction Disutrbances:right bundle branch block  Narrative Interpretation:   Old EKG Reviewed: changes noted    MDM  Alcohol intoxication  Pt has been observed in emergency department for approximately 4 hours.  Since arrival patient has become much more aware and states that her symptoms are due to her drinking vodka.  She states that she's been sober for a very long time and broke today.  She requests outpatient  resources.  She continues to deny any suicidal or homicidal ideations.  Patient is ambulatory in the emergency department without difficulty.  Patient is to be discharged in the care of her husband who agrees that she appears to be at her normal baseline mental status.  Patient appears to have a good support system at home. Conservative suicide risk is low estimation. Case discussed with attending who agrees w disposition plan      Jaci Carrel, PA-C 09/21/12 [address], PA-C 09/21/12 2337

## 2012-09-21 NOTE — ED Notes (Signed)
AVW:[id number]<XB> Expected date:<BR> Expected time:<BR> Means of arrival:<BR> Comments:<BR> ems seizure

## 2012-09-21 NOTE — ED Provider Notes (Signed)
Medical screening examination/treatment/procedure(s) were performed by non-physician practitioner and as supervising physician I was immediately available for consultation/collaboration.   Keondrick Dilks, MD 09/21/12 2358 

## 2012-09-21 NOTE — ED Notes (Signed)
Pt discharged home with husband.  Pt more alert and cooperative and able to understand speech.

## 2012-09-21 NOTE — ED Notes (Signed)
Main lab notified to draw blood.

## 2012-09-21 NOTE — ED Notes (Signed)
Pt has a hx of drug use and was found today sitting on the floor with an empty bottle of vodka. House was in disarray. Pt denies drug use today.

## 2012-09-21 NOTE — Discharge Instructions (Signed)
RESOURCE GUIDE  Chronic Pain Problems: Contact [facility]  [phone] Patients need to be referred by their primary care doctor.  Insufficient Money for Medicine: Contact United Way:  call "211."   No Primary Care Doctor: - Call Health Connect  [phone] - can help you locate a primary care doctor that  accepts your insurance, provides certain services, etc. - Physician Referral Service- 1-800-533-3463  Agencies that provide inexpensive medical care: - [facility]  [phone] - [facility]  [phone] - [facility]  [phone] - Women[facility] Clinic  [phone] - Planned Parenthood  [phone] - [facility]  [phone]  Medicaid-accepting [place] Providers: - [facility]- [address]  [phone], Mon-Fri 9am-7pm, Sat 9am-1pm - [facility]- [address], [address]  [phone] - [facility]- [address], [address]  [phone] - [facility]- [address] [place] [address]  [phone] - Veita Bland- [address], [phone]  Only accepts [place] Access Medicaid patients after they have their name  applied to their card  Self Pay (no insurance) in [place]: - Sickle Cell Patients: Dr Eric Dean, [facility]  [address], [phone] - [facility]- [address]  [phone]       -     [facility]- [address]       -     [facility]- see information above (Speak to Pam H if you do not have insurance)       -  [facility] [place]- [address],  [phone]       -  [facility]- [address] [place] [address], [phone]       -  Dr Osei-Bonsu-  [address], [place], [phone]       -  [facility] - [address], [phone]       -  [facility]- [address] [place] [address], [phone],  also [address], [phone]       -    [facility]- [address], [phone], 1st & 3rd Saturday        every month, 10am-1pm  [facility] [address] [place], [place] [address] [phone]  [facility] [address] [place], [place] [address] [phone]  1) Find a Doctor and Pay Out of Pocket Although you won't have to find out who is covered by your insurance plan, it is a good idea to ask around and get recommendations. You will then need to call the office and see if the doctor you have chosen will accept you as a new patient and what types of options they offer for patients who are self-pay. Some doctors offer discounts or will set up payment plans for their patients who do not have insurance, but you will need to ask so you aren't surprised when you get to your appointment.  2) Contact Your Local Health Department Not all health departments have doctors that can see patients for sick visits, but many do, so it is worth a call to see if yours does. If you don't know where your local health department is, you can check in your phone book. The CDC also has a tool   to help you locate your state's health department, and many state websites also have listings of all of their local health departments.  3) Find a Walk-in Clinic If your illness is not likely to be very severe or complicated, you may want to try a walk in clinic. These are popping up all over the country in pharmacies, drugstores, and shopping centers. They're usually staffed by nurse practitioners or physician assistants that have been trained to treat common illnesses and complaints. They're usually fairly quick and inexpensive. However, if you have serious medical issues or chronic medical problems, these are probably not your best option  STD Testing - [place] [facility] [place], STD Clinic, [address], [place],  phone [phone] or 1-877-539-9860.  Monday - Friday, call for an appointment. - [place] [facility] [place], [facility]D Clinic, [address], [place], phone [phone] or 1-877-539-9860.  Monday - Friday, call for an appointment.  Abuse/Neglect: - [place] Child Abuse Hotline (336) 641-3795 - [place] Child Abuse Hotline 800-378-5315 (After Hours)  Emergency Shelter:  [facility] (336) 271-5985  Maternity Homes: - [facility] (336) 275-9566 - [facility] (704) 372-4663  MRSA Hotline #:   832-7006  Dental Assistance If unable to pay or uninsured, contact:  [place] [facility] to become qualified for the adult dental clinic.  Patients with Medicaid: [facility] [address], [phone], [phone]  If unable to pay, or uninsured, contact [place] [facility] ([phone] in [place], [phone] in [place]) to become qualified for the adult dental clinic  [facility] [address] [place], [place] [address] [phone]) [phone] www.drcivils.com  Other Low-Cost Community Dental Services: - [facility]- [address], [place], [place], [address], [phone], Ext. 123, 2nd and 4th Thursday of the month at 6:30am.  10 clients each day by appointment, can sometimes see walk-in patients if someone does not show for an appointment. - [facility]- [address], [place], [place], [address], [phone] - [facility]- [address], [place], [place], [address], [phone] - [place] [facility]- [phone] - [facility]- [phone] - [facility]- [phone]       Behavioral Health Resources in the Community  Intensive Outpatient Programs: [place] [facility]      [address] [place], [place] [phone] Both a day and  evening program       [facility]     [address]        [place], [place] [address] [phone]         ADS: [facility] [address] [place] [phone]  [place] [facility] ACCESS LINE: 1-800-853-5163 or 336-641-4981 [address] [place], [place] [address] Http://www.[facility].com/services/adult.htm   Substance Abuse Resources: - [facility]  336-882-2125 - [facility] 336-784-9470 - [facility] 336-285-9073 - [facility] 336-845-3988 - Residential & Outpatient Substance Abuse Program  800-659-3381  Psychological Services: -  Health  [phone] - [facility]  [phone] - [place] [facility], [address], [place], ACCESS LINE: 1-800-853-5163 or 336-641-4981, Http://www.[facility].com/services/adult.htm  Mobile Crisis Teams:                                          [facility]         Mobile Crisis Care Unit 1-877-626-1772             [facility] [address] [place] [phone]                                         Interventionist Sharon DeEsch [address] [place] [phone]  Self-Help/Support Groups: [facility] [place] Variety of support groups [phone] (call for more info)   Narcotics Anonymous (NA) [facility] [address] [place] - 2 meetings at this location  Residential Treatment Programs:  [facility]      [address]        [place]       [phone]         [facility] [address] [place], [place]  [address] [phone]  [facility]  [address] [place], [place] [address] [phone] Admissions: 8am-3pm M-F  [facility]     [address]        [place], [place]  [address]       [phone]         [facility] [address] [place], [place] [phone]  [facility] [address] [place], [place] [phone]  [facility] - Intensive Outpatient      [address]     [place], [place]       [phone]         [facility]     [address] [place], [place] 877-615-2722 or 336-784-9470  [facility]idential Treatment Services (RTS), Medicaid [address] [place], [place] 336-227-7417  [facility]                                               [address]  [place] [phone]  [place] [facility] Resources: [facility]- 1-888-581-9988               General Therapy                                                Julie Brannon, PhD        [address]                                       [place], [place] [address]         [phone]   Insurance  [facility]   [address] [place], [place] [address] [phone]  [facility] [address] [place], [place] [address] [phone] Insurance/Medicaid/sponsorship through [facility]  [facility]                                              [address]                                          [place], [place] [address]    Therapy/tele-psych/case         [phone]          [facility] [address]   [place], [place]  [address]  Adolescent/group home/case management [phone]                                           Julia Brannon PhD       General therapy       Insurance   [phone]         Dr. Arfeen, Insurance, M-F 336- [phone]  [facility] [place]  [employer] [place] [facility] [address]                 [address]         [address]  [place]                                               [place]                              [place] Phone:  [phone]                                  Phone:   [phone]                   Phone:  [phone]  [place] [facility], [phone] - [place] Services - [facility]- 1-888-581-9988       -     [facility] in [place], [address],             [phone], Insurance  [place] Child Abuse Hotline (336) 342-1394 or (336) 342-3537 (After Hours)  

## 2012-09-21 NOTE — ED Notes (Signed)
Phlebotomy attempted x2 unsuccessfully. RN made aware

## 2012-09-21 NOTE — ED Notes (Signed)
Pt became irate and was upset that she had to wait for blood to be resulted and stated that she wanted food and wanted to go home. Charge nurse was able to deescalate and pt was given sandwich. Lisette, PA also went to see pt.

## 2012-09-22 LAB — ETHANOL: Alcohol, Ethyl (B): 264 mg/dL — ABNORMAL HIGH (ref 0–11)

## 2012-09-29 ENCOUNTER — Encounter: Payer: Self-pay | Admitting: Physician Assistant

## 2012-09-29 ENCOUNTER — Ambulatory Visit: Payer: Self-pay | Admitting: Physician Assistant

## 2012-09-29 VITALS — BP 124/84 | HR 119 | Temp 98.8°F | Resp 16 | Ht 69.5 in | Wt 207.6 lb

## 2012-09-29 DIAGNOSIS — M545 Low back pain, unspecified: Secondary | ICD-10-CM

## 2012-09-29 DIAGNOSIS — M4716 Other spondylosis with myelopathy, lumbar region: Secondary | ICD-10-CM | POA: Insufficient documentation

## 2012-09-29 DIAGNOSIS — F411 Generalized anxiety disorder: Secondary | ICD-10-CM

## 2012-09-29 MED ORDER — GABAPENTIN 300 MG PO CAPS: ORAL_CAPSULE | ORAL | Status: DC

## 2012-09-29 MED ORDER — CLONAZEPAM 1 MG PO TABS: 1.0000 mg | ORAL_TABLET | Freq: Three times a day (TID) | ORAL | Status: DC | PRN

## 2012-09-29 NOTE — Progress Notes (Signed)
  Subjective:    Patient ID: Daisy Tucker, female    DOB: 06-Oct-1975, 37 y.o.   MRN: [id number]  HPI This 37 y.o. female presents for evaluation of GAD and low back pain, which radiates into the legs.  Previous notes are reviewed.  She has been resistant to treatment of GAD other than benzos.  Marland Kitchen  Has been taking neurontin 300 mg BID, instead of QHS.  Would like to increase the dose. She reports that following the surgery on her back, she took 800 mg QID with good results.  Past medical history, surgical history, family history, social history and problem list reviewed.  Review of Systems No chest pain, SOB, HA, dizziness, vision change, N/V, diarrhea, constipation, dysuria, urinary urgency or frequency, or rash.     Objective:   Physical Exam  Vitals reviewed. Constitutional: She is oriented to person, place, and time. Vital signs are normal. She appears well-developed and well-nourished. She is active and cooperative. No distress.  HENT:  Head: Normocephalic and atraumatic.  Right Ear: Hearing normal.  Left Ear: Hearing normal.  Eyes: EOM are normal. Pupils are equal, round, and reactive to light.  Neck: Normal range of motion. Neck supple. No thyromegaly present.  Cardiovascular: Normal rate, regular rhythm and normal heart sounds.   Pulses:      Radial pulses are 2+ on the right side, and 2+ on the left side.       Dorsalis pedis pulses are 2+ on the right side, and 2+ on the left side.       Posterior tibial pulses are 2+ on the right side, and 2+ on the left side.  Pulmonary/Chest: Effort normal and breath sounds normal.  Musculoskeletal:       Lumbar back: She exhibits tenderness and pain. She exhibits normal range of motion, no bony tenderness, no swelling, no edema, no deformity, no laceration and no spasm.  Lymphadenopathy:       Head (right side): No tonsillar, no preauricular, no posterior auricular and no occipital adenopathy present.       Head (left side): No  tonsillar, no preauricular, no posterior auricular and no occipital adenopathy present.    She has no cervical adenopathy.       Right: No supraclavicular adenopathy present.       Left: No supraclavicular adenopathy present.  Neurological: She is alert and oriented to person, place, and time. She has normal strength. No cranial nerve deficit or sensory deficit.  Skin: Skin is warm, dry and intact. No rash noted. No cyanosis or erythema. Nails show no clubbing.  Psychiatric: She has a normal mood and affect.          Assessment & Plan:  Low back pain radiating to right leg - Plan: gabapentin (NEURONTIN) 300 MG capsule. Increase the dose weekly by 300 mg, up to 900 mg TID or resolution of pain.  GAD (generalized anxiety disorder) - Plan: clonazePAM (KLONOPIN) 1 MG tablet; hopefully she'll have a reduction in anxiety symptoms with reduction in pain.  Will continue to encourage trial of alternative to benzos.   Fernande Bras, PA-C Physician Assistant-Certified [facility]

## 2012-10-02 ENCOUNTER — Other Ambulatory Visit: Payer: Self-pay | Admitting: Physician Assistant

## 2012-11-03 ENCOUNTER — Ambulatory Visit: Payer: Self-pay | Admitting: Physician Assistant

## 2012-11-05 ENCOUNTER — Other Ambulatory Visit: Payer: Self-pay | Admitting: Physician Assistant

## 2012-11-06 ENCOUNTER — Other Ambulatory Visit: Payer: Self-pay | Admitting: Physician Assistant

## 2012-12-05 ENCOUNTER — Other Ambulatory Visit: Payer: Self-pay | Admitting: Physician Assistant

## 2012-12-07 ENCOUNTER — Telehealth: Payer: Self-pay

## 2012-12-07 MED ORDER — PROMETHAZINE HCL 25 MG PO TABS: ORAL_TABLET | ORAL | Status: DC

## 2012-12-07 NOTE — Telephone Encounter (Signed)
Per Chelle ok to fill promethazine to.

## 2012-12-07 NOTE — Telephone Encounter (Signed)
I got the rx for clonazepam but can she have the promethazine too?

## 2012-12-07 NOTE — Telephone Encounter (Signed)
Patient states this is her second call regarding her rx refills. She completely out of her clonazepam and promethazine. Patient of [employer]. Has an appt scheduled for august but needs meds until her appt. CB # [id number].

## 2013-01-05 ENCOUNTER — Ambulatory Visit: Payer: Self-pay | Admitting: Physician Assistant

## 2013-01-05 ENCOUNTER — Encounter: Payer: Self-pay | Admitting: Physician Assistant

## 2013-01-05 VITALS — BP 138/99 | HR 101 | Temp 98.5°F | Resp 18 | Ht 70.0 in | Wt 215.0 lb

## 2013-01-05 DIAGNOSIS — R609 Edema, unspecified: Secondary | ICD-10-CM

## 2013-01-05 DIAGNOSIS — M545 Low back pain, unspecified: Secondary | ICD-10-CM

## 2013-01-05 DIAGNOSIS — F411 Generalized anxiety disorder: Secondary | ICD-10-CM

## 2013-01-05 MED ORDER — CLONAZEPAM 1 MG PO TABS: ORAL_TABLET | ORAL | Status: DC

## 2013-01-05 MED ORDER — BUSPIRONE HCL 15 MG PO TABS: ORAL_TABLET | ORAL | Status: DC

## 2013-01-05 MED ORDER — PROMETHAZINE HCL 25 MG PO TABS: ORAL_TABLET | ORAL | Status: DC

## 2013-01-05 NOTE — Progress Notes (Signed)
I have examined this patient along with the student and agree.  LE edema is mild, minimally R>L. She will gradually increase gabapentin by 300 mg weekly UP TO 900 mg TID. Expect to see improved anxiety symptoms with improved pain control. Has long been reluctant to try maintenance medications for anxiety.  Previous use of sertraline associated with suicidal thoughts.  Has also tried Prozac, Effexor, Wellbutrin. Trial of Buspar, 7.5 mg BID, increasing by 5 mg every 2-3 days up to 15 mg BID.  Expects to be covered by her husband's insurance in the next 6 weeks.  Plan follow-up at that time, sooner if needed. Will refer to vein specialist once she has insurance.  Will also update labs, including CBC, CMET, lipids and TSH.

## 2013-01-05 NOTE — Patient Instructions (Addendum)
You may increase the neurontin by 300 mg each week, up to 900 mg THREE TIMES DAILY.

## 2013-01-05 NOTE — Progress Notes (Signed)
Subjective:    Patient ID: Daisy Tucker, female    DOB: [birth year], 37 y.o.   MRN: [id number]  HPI 37 y.o. Female presents to clinic today for follow up on GAD, low back pain, and medication refill. Patient feels that her anxiety is no longer controlled with the Klonopin. She has been taking the same dose of Klonopin for 10 years now. She has noticed recently that she is very "snappy" and has panic attacks. She is nervous about coming off Klonopin but understands that she has most likely developed a tolerance to it at this point. She would like to consider another medication to help with her anxiety.   She also has concerns about her right leg being larger than her left leg. She has had evaluation for this previously by Dr. Neva Tucker. She has had this right leg swelling for past 1 year, Has lost 20 pounds since last ov with Daisy Tucker. Swelling going down some, but now came back Took hctz for 1 month last year - but did not help. No pain in calf. No chest pain/ dyspnea. Foot was swollen in past, but not now. Noticed a bump in top of R foot when swelling resolved. No pain in bump. This was noted in 9/13 at physical.   Review of Systems  Constitutional: Negative for fever, chills, activity change, appetite change and fatigue.  HENT: Negative for congestion, sore throat, trouble swallowing, neck pain, neck stiffness and sinus pressure.   Eyes: Negative for visual disturbance.  Respiratory: Negative for choking, chest tightness and shortness of breath.   Cardiovascular: Positive for leg swelling. Negative for chest pain and palpitations.  Gastrointestinal: Negative for nausea, vomiting, abdominal pain, diarrhea and constipation.  Genitourinary: Negative for urgency, frequency, decreased urine volume and difficulty urinating.  Musculoskeletal: Positive for back pain (2nd to back surgery ). Negative for myalgias and arthralgias.  Skin: Negative for pallor and rash.  Neurological: Negative for dizziness,  light-headedness and headaches.  Psychiatric/Behavioral: The patient is nervous/anxious.   All other systems reviewed and are negative.      Objective:   Physical Exam  Nursing note and vitals reviewed. Constitutional: She is oriented to person, place, and time. Vital signs are normal. She appears well-developed and well-nourished. No distress.  HENT:  Head: Normocephalic and atraumatic.  Right Ear: External ear normal.  Left Ear: External ear normal.  Nose: Nose normal.  Eyes: Conjunctivae are normal.  Neck: Trachea normal and normal range of motion. Neck supple. No mass and no thyromegaly present.  Cardiovascular: Normal rate, regular rhythm, normal heart sounds and intact distal pulses.   Pulmonary/Chest: Effort normal and breath sounds normal.  Abdominal: Soft. Normal appearance and bowel sounds are normal.  Musculoskeletal: Normal range of motion.  Right leg pretibial edema. Right leg larger than left. Possible cyst on dorsal surface of right foot. No tenderness or pain, varicosities, or erythema.   Lymphadenopathy:       Head (right side): No submental, no submandibular, no tonsillar, no preauricular, no posterior auricular and no occipital adenopathy present.       Head (left side): No submental, no submandibular, no tonsillar, no preauricular, no posterior auricular and no occipital adenopathy present.    She has no cervical adenopathy.       Right: No supraclavicular adenopathy present.       Left: No supraclavicular adenopathy present.  Neurological: She is alert and oriented to person, place, and time. She has normal strength.  Skin: Skin is warm, dry  and intact. No rash noted. She is not diaphoretic. No pallor.  Psychiatric: She has a normal mood and affect. Her speech is normal and behavior is normal. Judgment and thought content normal. Cognition and memory are normal.       Assessment & Plan:  GAD (generalized anxiety disorder) - Plan: clonazePAM (KLONOPIN) 1 MG  tablet, promethazine (PHENERGAN) 25 MG tablet, busPIRone (BUSPAR) 15 MG tablet. Pt instructed to continue medications as prescribed. Starting her on Buspar to see if her anxiety can better be controlled to help decrease her use of Klonopin.   Low back pain radiating to right leg- patient to continue taking Gabapentin. Instructed her that she may take up to 900 mg TID.   Edema- Once patient gets on her husbands insurance she intends to pursue the edema in her right leg that she has been having for about 1 year now.

## 2013-01-18 ENCOUNTER — Encounter (HOSPITAL_COMMUNITY): Payer: Self-pay | Admitting: Emergency Medicine

## 2013-01-18 ENCOUNTER — Emergency Department (HOSPITAL_COMMUNITY)
Admission: EM | Admit: 2013-01-18 | Discharge: 2013-01-18 | Disposition: A | Discharge status: Home / Self Care | Payer: Medicaid Other | Attending: Emergency Medicine | Admitting: Emergency Medicine

## 2013-01-18 DIAGNOSIS — O9989 Other specified diseases and conditions complicating pregnancy, childbirth and the puerperium: Secondary | ICD-10-CM | POA: Insufficient documentation

## 2013-01-18 DIAGNOSIS — O99340 Other mental disorders complicating pregnancy, unspecified trimester: Secondary | ICD-10-CM | POA: Insufficient documentation

## 2013-01-18 DIAGNOSIS — Z79899 Other long term (current) drug therapy: Secondary | ICD-10-CM | POA: Insufficient documentation

## 2013-01-18 DIAGNOSIS — F411 Generalized anxiety disorder: Secondary | ICD-10-CM | POA: Insufficient documentation

## 2013-01-18 DIAGNOSIS — O99330 Smoking (tobacco) complicating pregnancy, unspecified trimester: Secondary | ICD-10-CM | POA: Insufficient documentation

## 2013-01-18 DIAGNOSIS — Z3201 Encounter for pregnancy test, result positive: Secondary | ICD-10-CM

## 2013-01-18 DIAGNOSIS — M7989 Other specified soft tissue disorders: Secondary | ICD-10-CM

## 2013-01-18 LAB — POCT PREGNANCY, URINE: Preg Test, Ur: POSITIVE — AB

## 2013-01-18 LAB — URINALYSIS, ROUTINE W REFLEX MICROSCOPIC
Bilirubin Urine: NEGATIVE
Glucose, UA: NEGATIVE mg/dL
Hgb urine dipstick: NEGATIVE
Ketones, ur: NEGATIVE mg/dL
Leukocytes, UA: NEGATIVE
Nitrite: NEGATIVE
Protein, ur: NEGATIVE mg/dL
Specific Gravity, Urine: 1.013 (ref 1.005–1.030)
Urobilinogen, UA: 0.2 mg/dL (ref 0.0–1.0)
pH: 6 (ref 5.0–8.0)

## 2013-01-18 NOTE — ED Provider Notes (Signed)
CSN: [id number]     Arrival date & time 01/18/13  2018 History   First MD Initiated Contact with Patient 01/18/13 2115     Chief Complaint  Patient presents with  . Leg Swelling   (Consider location/radiation/quality/duration/timing/severity/associated sxs/prior Treatment) HPI Comments: Patient is a 37 year old female who presents for swelling to her right lower extremity. Patient states the symptoms have been intermittent for the past year, but worsening over the last 24 hours. Patient denies any aggravating or alleviating factors of her symptoms. Swelling today was associated with a pain in her right lower extremity coursing from her thigh down to her ankle. Patient denies associated fever, shortness of breath, cough, hemoptysis, lightheadedness or dizziness, chest pain, erythema or pallor, numbness or tingling, and extremity weakness. Patient further denies a history of DVT/PE, recent hospitalizations or surgeries, hormone replacement, and prolonged periods of travel. Patient's last menstrual period was at the beginning of May; patient, as an aside, states that she believes she is pregnant. She denies vaginal discharge, vaginal bleeding, and abdominal pain.  The history is provided by the patient. No language interpreter was used.    Past Medical History  Diagnosis Date  . Anxiety   . Miscarriage     Rh negative  . Nausea    Past Surgical History  Procedure Laterality Date  . Spine surgery  2008    L4-5 disc reconstruction   Family History  Problem Relation Age of Onset  . Stroke Father     x 3  . Alcohol abuse Mother    History  Substance Use Topics  . Smoking status: Current Some Day Smoker    Types: Cigarettes  . Smokeless tobacco: Never Used     Comment: cutting back, down to <.5 ppd, some days only 2 cigarettes  . Alcohol Use: No   OB History   Grav Para Term Preterm Abortions TAB SAB Ect Mult Living   2 1 1  0 1 1 0 0 0 1     Review of Systems  Constitutional:  Negative for fever.  Respiratory: Negative for cough and shortness of breath.   Cardiovascular: Positive for leg swelling (RLE).  Gastrointestinal: Negative for nausea, vomiting and abdominal pain.  Genitourinary: Negative for urgency, vaginal bleeding, vaginal discharge and vaginal pain.  Skin: Negative for color change and pallor.  Neurological: Negative for weakness and numbness.  All other systems reviewed and are negative.   Allergies  Asa arthritis strength-antacid and Zofran  Home Medications   Current Outpatient Rx  Name  Route  Sig  Dispense  Refill  . clonazePAM (KLONOPIN) 1 MG tablet      TAKE ONE TABLET BY MOUTH THREE TIMES DAILY AS NEEDED FOR  ANXIETY   90 tablet   0   . gabapentin (NEURONTIN) 300 MG capsule   Oral   Take 300 mg by mouth every other day.         . ibuprofen (ADVIL,MOTRIN) 800 MG tablet   Oral   Take 800 mg by mouth 3 (three) times daily.          . Prenatal Vit-Fe Fumarate-FA (PRENATAL MULTIVITAMIN) TABS tablet   Oral   Take 1 tablet by mouth daily at 12 noon.         . promethazine (PHENERGAN) 25 MG tablet      TAKE ONE TABLET BY MOUTH EVERY 8 HOURS AS NEEDED FOR NAUSEA   60 tablet   3   . busPIRone (BUSPAR) 15 MG tablet  Take 1/2 tablet (7.5 mg) twice daily.  Increase by 1/3 tablet (5 mg) every 2-3 days, up to 1 tablet (15 mg) twice daily.   60 tablet   3    BP 138/91  Pulse 97  Temp(Src) 98.1 F (36.7 C) (Oral)  Resp 20  SpO2 99%  LMP 09/17/2012  Physical Exam  Nursing note and vitals reviewed. Constitutional: She is oriented to person, place, and time. She appears well-developed and well-nourished. No distress.  HENT:  Head: Normocephalic and atraumatic.  Eyes: Conjunctivae and EOM are normal. Pupils are equal, round, and reactive to light. No scleral icterus.  Neck: Normal range of motion.  Cardiovascular: Normal rate, regular rhythm, normal heart sounds and intact distal pulses.   Femoral, DP, and PT pulses  2+ b/l. Capillary refill normal.  Pulmonary/Chest: Effort normal and breath sounds normal. No respiratory distress. She has no wheezes. She has no rales.  Abdominal: Soft. There is no tenderness.  Musculoskeletal: Normal range of motion.  2+ pitting edema in RLE, primarily around R ankle. Full ROM of RLE without TTP, calf tenderness, erythema, or heat-to-touch.  Neurological: She is alert and oriented to person, place, and time. She has normal reflexes.  No sensory or motor deficits appreciated.  Skin: Skin is warm and dry. No rash noted. She is not diaphoretic. No erythema. No pallor.  Psychiatric: She has a normal mood and affect. Her behavior is normal.    ED Course  Procedures (including critical care time) Labs Review Labs Reviewed  POCT PREGNANCY, URINE - Abnormal; Notable for the following:    Preg Test, Ur POSITIVE (*)    All other components within normal limits  URINALYSIS, ROUTINE W REFLEX MICROSCOPIC   Imaging Review No results found.  MDM   1. Right leg swelling   2. Positive pregnancy test    Patient presents for worsening swelling of her RLE x 1 day. Patient well and nontoxic appearing, hemodynamically stable, and afebrile arrival. Physical exam findings as above. Patient neurovascularly intact with 2+ pitting edema in her right ankle. There is no erythema or heat-to-touch appreciated on physical exam to suggest cellulitic/infectious soft tissue process. No calf tenderness to palpation. Urine pregnancy positive. No complicating features of pregnancy to warrant further w/u at this time. Given recent pregnancy, police symptoms or further workup with venous duplex of the right lower extremity to evaluate for DVT. Have discussed initial dosing with Lovenox in ED with Dr. Juleen China who believe that, given chronicity of symptoms, Lovenox can be held until venous duplex completed in AM. Patient appropriate for d/c with U/S scheduled as outpatient for tomorrow. OBGYN f/u advised for  prenatal care. Return precautions advised and patient agreeable to plan with no unaddressed concerns.    Antony Madura, PA-C 01/19/13 [phone]

## 2013-01-18 NOTE — ED Notes (Signed)
Pt states that she has had rt leg swelling on and off for the last year. She has been seeing her PCP concerning it. Today, she began having severe rt lower back pain and her rt leg is obviously swollen from the thigh to the foot. Edema is +4. Leg is slightly red but not hot to the touch. Pt believes she is pregnant, but it has not been confirmed.

## 2013-01-19 ENCOUNTER — Emergency Department (HOSPITAL_COMMUNITY)
Admission: EM | Admit: 2013-01-19 | Discharge: 2013-01-19 | Disposition: A | Discharge status: Home / Self Care | Payer: Medicaid Other | Attending: Emergency Medicine | Admitting: Emergency Medicine

## 2013-01-19 ENCOUNTER — Ambulatory Visit (HOSPITAL_COMMUNITY)
Admission: RE | Admit: 2013-01-19 | Discharge: 2013-01-19 | Disposition: A | Discharge status: Home / Self Care | Payer: Medicaid Other | Source: Ambulatory Visit

## 2013-01-19 ENCOUNTER — Encounter (HOSPITAL_COMMUNITY): Payer: Self-pay | Admitting: Emergency Medicine

## 2013-01-19 DIAGNOSIS — Z79899 Other long term (current) drug therapy: Secondary | ICD-10-CM | POA: Insufficient documentation

## 2013-01-19 DIAGNOSIS — F411 Generalized anxiety disorder: Secondary | ICD-10-CM | POA: Insufficient documentation

## 2013-01-19 DIAGNOSIS — R600 Localized edema: Secondary | ICD-10-CM

## 2013-01-19 DIAGNOSIS — O99330 Smoking (tobacco) complicating pregnancy, unspecified trimester: Secondary | ICD-10-CM | POA: Insufficient documentation

## 2013-01-19 DIAGNOSIS — R609 Edema, unspecified: Secondary | ICD-10-CM | POA: Insufficient documentation

## 2013-01-19 DIAGNOSIS — O9989 Other specified diseases and conditions complicating pregnancy, childbirth and the puerperium: Secondary | ICD-10-CM | POA: Insufficient documentation

## 2013-01-19 LAB — POCT I-STAT, CHEM 8
BUN: 7 mg/dL (ref 6–23)
Calcium, Ion: 1.28 mmol/L — ABNORMAL HIGH (ref 1.12–1.23)
Chloride: 103 mEq/L (ref 96–112)
Creatinine, Ser: 0.7 mg/dL (ref 0.50–1.10)
Glucose, Bld: 82 mg/dL (ref 70–99)
HCT: 37 % (ref 36.0–46.0)
Hemoglobin: 12.6 g/dL (ref 12.0–15.0)
Potassium: 3.7 mEq/L (ref 3.5–5.1)
Sodium: 137 mEq/L (ref 135–145)
TCO2: 23 mmol/L (ref 0–100)

## 2013-01-19 NOTE — Progress Notes (Signed)
*  PRELIMINARY RESULTS* Vascular Ultrasound Right lower extremity venous duplex has been completed.  Preliminary findings: negative for DVT and baker's cyst.   Farrel Demark, RDMS, RVT  01/19/2013, 9:13 AM

## 2013-01-19 NOTE — ED Provider Notes (Signed)
CSN: [id number]     Arrival date & time 01/19/13  4098 History   First MD Initiated Contact with Patient 01/19/13 [phone]     Chief Complaint  Patient presents with  . Leg Swelling   (Consider location/radiation/quality/duration/timing/severity/associated sxs/prior Treatment) HPI Pt presents to ED c/o right lower extremity swelling. She reports hx of 1 + yr history of right lower extremity swelling. She was treated with HCTZ for 1 mo about a year ago with no decrease in swelling. She reports it took ~3 mo for swelling to improve, but she says it never returned to normal. Yesterday she was working in the yard and realized her right lower extremity had swelled up again. She was in the ER yesterday where she was found to be ~[redacted] weeks pregnant and referred her for a lower extremity duplex to rule out DVT. Duplex was performed this morning and was negative for signs of DVT. Pt returns concerned with the right lower extremity swelling. She reports some right knee pain 2/2 the swelling/edema in her knee. She denies any recent travel, birth control use, recent surgeries, chest pain/tightness, heart palpitations, cough, orthopnea, PND, fever, chills, bowel changes, bladder changes, other extremity swelling. She endorses some dyspnea on exertion, but relates this to her pregnancy. Past Medical History  Diagnosis Date  . Anxiety   . Miscarriage     Rh negative  . Nausea    Past Surgical History  Procedure Laterality Date  . Spine surgery  2008    L4-5 disc reconstruction   Family History  Problem Relation Age of Onset  . Stroke Father     x 3  . Alcohol abuse Mother    History  Substance Use Topics  . Smoking status: Current Some Day Smoker    Types: Cigarettes  . Smokeless tobacco: Never Used     Comment: cutting back, down to <.5 ppd, some days only 2 cigarettes  . Alcohol Use: No   OB History   Grav Para Term Preterm Abortions TAB SAB Ect Mult Living   2 1 1  0 1 1 0 0 0 1     Review of  Systems  All other systems negative except as documented in the HPI. All pertinent positives and negatives as reviewed in the HPI.   Allergies  Asa arthritis strength-antacid and Zofran  Home Medications   Current Outpatient Rx  Name  Route  Sig  Dispense  Refill  . busPIRone (BUSPAR) 15 MG tablet      Take 1/2 tablet (7.5 mg) twice daily.  Increase by 1/3 tablet (5 mg) every 2-3 days, up to 1 tablet (15 mg) twice daily.   60 tablet   3   . clonazePAM (KLONOPIN) 1 MG tablet   Oral   Take 0.5 mg by mouth 2 (two) times daily as needed (anxiety). TAKE ONE TABLET BY MOUTH THREE TIMES DAILY AS NEEDED FOR  ANXIETY         . gabapentin (NEURONTIN) 300 MG capsule   Oral   Take 300 mg by mouth every other day.         . ibuprofen (ADVIL,MOTRIN) 800 MG tablet   Oral   Take 800 mg by mouth 3 (three) times daily.          . Prenatal Vit-Fe Fumarate-FA (PRENATAL MULTIVITAMIN) TABS tablet   Oral   Take 1 tablet by mouth daily at 12 noon.         . promethazine (PHENERGAN) 25 MG tablet  TAKE ONE TABLET BY MOUTH EVERY 8 HOURS AS NEEDED FOR NAUSEA   60 tablet   3    BP 120/87  Pulse 98  Temp(Src) 99 F (37.2 C) (Oral)  Resp 18  SpO2 98%  LMP 09/17/2012 Physical Exam  Constitutional: She is oriented to person, place, and time. Vital signs are normal. She appears well-developed and well-nourished.  HENT:  Head: Normocephalic and atraumatic.  Neck: Normal carotid pulses and no JVD present.  Cardiovascular: Normal rate, regular rhythm, S1 normal, S2 normal and normal heart sounds.  Decreased pulses: Unable to palpate right lower extremity pulses 2/2 edema.   Pulses:      Radial pulses are 2+ on the right side, and 2+ on the left side.       Popliteal pulses are 2+ on the left side.       Dorsalis pedis pulses are 2+ on the left side.       Posterior tibial pulses are 2+ on the left side.  Unable to palpate   Pulmonary/Chest: Effort normal and breath sounds normal. No  respiratory distress. She has no decreased breath sounds. She has no wheezes. She exhibits no tenderness.  Abdominal: Soft. Normal appearance and bowel sounds are normal. She exhibits no distension, no fluid wave and no ascites. There is no tenderness. There is no rebound.  Musculoskeletal: Normal range of motion. She exhibits edema.       Right knee: She exhibits swelling. She exhibits normal range of motion, no effusion, no deformity, no laceration and no erythema. No tenderness found.       Left knee: Normal. She exhibits normal range of motion, no swelling, no deformity and no erythema. No tenderness found.       Right ankle: She exhibits swelling. She exhibits normal range of motion, no ecchymosis, no deformity and no laceration. No tenderness.       Left ankle: Normal.       Right upper arm: Normal.       Left upper arm: Normal.       Right forearm: Normal.       Left forearm: Normal.       Right upper leg: She exhibits edema. She exhibits no tenderness, no bony tenderness, no swelling, no deformity and no laceration.       Left upper leg: Normal.       Right lower leg: She exhibits edema. She exhibits no tenderness, no bony tenderness, no swelling, no deformity and no laceration.       Left lower leg: Normal.       Right foot: She exhibits swelling. She exhibits normal range of motion, no tenderness, no bony tenderness, no crepitus, no deformity and no laceration.       Left foot: Normal.  2+ pitting edema to knee in right lower extremity. Right thigh also is enlarged compared to left thigh. No pain to palpation of lower extremity and calf b/l. Full ROM. No skin, hair, or temperature change in the lower extremities.  Neurological: She is alert and oriented to person, place, and time.  Skin: Skin is warm and dry. No erythema.  Psychiatric: She has a normal mood and affect. Her behavior is normal. Judgment and thought content normal.    ED Course  Procedures (including critical care  time) Patient is referred back to her primary Dr. and her GYN.  Advised her that they may need several other outpatient test to determine the cause of this issue that been  ongoing for the patient.  The patient agrees to the plan and all questions were answered.  She does not have any kidney dysfunction or electrolyte abnormality, and her DVT study was negative  MDM      Carlyle Dolly, PA-C 01/19/13 1506

## 2013-01-19 NOTE — ED Provider Notes (Signed)
Medical screening examination/treatment/procedure(s) were performed by non-physician practitioner and as supervising physician I was immediately available for consultation/collaboration.   Dagmar Hait, MD 01/19/13 [phone]

## 2013-01-19 NOTE — ED Notes (Signed)
Pt c/o right leg swelling that has been swollen from thigh down to toes x1 year that is more swollen than it ever has been. Pt had test done this morning to confirm that she did not have a blood clot in her leg. Stated that she is [redacted]weeks pregnant. Stated the only pain she has is in her knee. Hx of back surgery where she had a rod placed in 2008.

## 2013-01-19 NOTE — ED Provider Notes (Signed)
Medical screening examination/treatment/procedure(s) were performed by non-physician practitioner and as supervising physician I was immediately available for consultation/collaboration.  Kaleeyah Cuffie, MD 01/19/13 0055 

## 2013-02-01 ENCOUNTER — Other Ambulatory Visit: Payer: Self-pay | Admitting: Physician Assistant

## 2013-02-02 ENCOUNTER — Ambulatory Visit: Payer: Self-pay | Admitting: Physician Assistant

## 2013-02-02 ENCOUNTER — Other Ambulatory Visit: Payer: Self-pay | Admitting: Radiology

## 2013-02-02 ENCOUNTER — Encounter: Payer: Self-pay | Admitting: Physician Assistant

## 2013-02-02 VITALS — BP 127/86 | HR 91 | Temp 98.6°F | Resp 18 | Ht 70.0 in | Wt 217.0 lb

## 2013-02-02 DIAGNOSIS — R609 Edema, unspecified: Secondary | ICD-10-CM

## 2013-02-02 DIAGNOSIS — Z331 Pregnant state, incidental: Secondary | ICD-10-CM

## 2013-02-02 DIAGNOSIS — M545 Low back pain, unspecified: Secondary | ICD-10-CM

## 2013-02-02 DIAGNOSIS — Z3490 Encounter for supervision of normal pregnancy, unspecified, unspecified trimester: Secondary | ICD-10-CM

## 2013-02-02 DIAGNOSIS — F411 Generalized anxiety disorder: Secondary | ICD-10-CM

## 2013-02-02 NOTE — Progress Notes (Signed)
Subjective:    Patient ID: Daisy Tucker, female    DOB: 06/09/1975, 37 y.o.   MRN: [id number]  HPI This 37 y.o. female presents for evaluation of RIGHT lower extremity edema.    This issue has been ongoing for over a year.  She intermittently gets significant swelling, all the way to the thigh, but in general, the RIGHT leg is larger than the LEFT.  It's worse with prolonged standing and walking, and better in the mornings.  She's been to the ED twice this summer with swelling more than usual, which frightened her.  Chemistries were negative and doppler was negative for DVT.  She was advised to follow up with cardiology, but no referral was made.  At her last ED visit, she also discovered that she is pregnant, estimated 20 weeks, after 6 years of trying unsuccessfully. LMP 09/17/2012, though had some spotting in June.  EDD 06/23/2013. She started prenatal vitamins, is working on cutting back her clonazepam and Neurontin doses, with plans to discontinue. She has not started Buspar yet, over concern for it's safety during pregnancy (it's category B). She needs a referral to OB.  Since I saw her last, she has qualified for Medicaid.  Patient Active Problem List   Diagnosis Date Noted  . Edema 01/05/2013  . Low back pain radiating to right leg 09/29/2012  . GAD (generalized anxiety disorder) 02/04/2012    Past Medical History  Diagnosis Date  . Anxiety   . Miscarriage     Rh negative  . Nausea     Past Surgical History  Procedure Laterality Date  . Spine surgery  2008    L4-5 disc reconstruction    Prior to Admission medications   Medication Sig Start Date End Date Taking? Authorizing Provider  clonazePAM (KLONOPIN) 1 MG tablet TAKE ONE TABLET BY MOUTH THREE TIMES DAILY AS NEEDED. 02/01/13  Yes Georgeanne Frankland S Jailah Willis, PA-C  gabapentin (NEURONTIN) 300 MG capsule Take 300 mg by mouth every other day.   Yes Historical Provider, MD  ibuprofen (ADVIL,MOTRIN) 800 MG tablet Take 800 mg by  mouth 3 (three) times daily.    Yes Historical Provider, MD  Prenatal Vit-Fe Fumarate-FA (PRENATAL MULTIVITAMIN) TABS tablet Take 1 tablet by mouth daily at 12 noon.   Yes Historical Provider, MD  promethazine (PHENERGAN) 25 MG tablet TAKE ONE TABLET BY MOUTH EVERY 8 HOURS AS NEEDED FOR NAUSEA 01/05/13  Yes Zykee Avakian S Johanna Matto, PA-C  busPIRone (BUSPAR) 15 MG tablet Take 1/2 tablet (7.5 mg) twice daily.  Increase by 1/3 tablet (5 mg) every 2-3 days, up to 1 tablet (15 mg) twice daily. 01/05/13   Minnie Shi Tessa Lerner, PA-C    Allergies  Allergen Reactions  . Asa Arthritis Strength-Antacid [Aspirin Buffered] Other (See Comments)    Cramps  . Zofran Hives and Swelling    History   Social History  . Marital Status: Married    Spouse Name: Gaylyn Rong    Number of Children: 1  . Years of Education: 13   Occupational History  . self-employed     English as a second language teacher   Social History Main Topics  . Smoking status: Current Some Day Smoker    Types: Cigarettes  . Smokeless tobacco: Never Used     Comment: cutting back, down to <.5 ppd, some days only 2 cigarettes  . Alcohol Use: No  . Drug Use: No     Comment: Pt states she is not currently using anything  . Sexual Activity: Yes  Partners: Male    Copy: None     Comment: desires pregnancy   Other Topics Concern  . Not on file   Social History Narrative   Married Gaylyn Rong 12/24/2007. His son lives with his mother.   Daughter, Sherian Maroon, born 03/22/2004 from a previous relationship, lives with them.    Family History  Problem Relation Age of Onset  . Stroke Father     x 3  . Alcohol abuse Mother     Review of Systems No chest pain, SOB, HA, dizziness, vision change, N/V, diarrhea, constipation, dysuria, urinary urgency or frequency, myalgias, arthralgias or rash.     Objective:   Physical Exam Blood pressure 127/86, pulse 91, temperature 98.6 F (37 C), resp. rate 18, height 5\' 10"  (1.778 m), weight 217 lb (98.431 kg),  last menstrual period 09/17/2012. Body mass index is 31.14 kg/(m^2). Well-developed, well nourished WF who is awake, alert and oriented, in NAD. HEENT: [place]/AT, slera and conjunctiva are clear.   Neck: supple, non-tender, no lymphadenopathy, thyromegaly. Heart: RRR, no murmur Lungs: normal effort, CTA Abdomen: normo-active bowel sounds, supple, non-tender, no mass or organomegaly. Extremities: no cyanosis, clubbing; 1+ edema of the RIGHT lower leg, none of the LEFT. Skin: warm and dry without rash. Psychologic: good mood and appropriate affect, normal speech and behavior.        Assessment & Plan:  GAD (generalized anxiety disorder) - she will taper off clonazepam.  OK to take Buspar.  Low back pain radiating to right leg - taper off Neurontin.  Discuss with OB what alternatives they'd prefer during her pregnancy.  Edema - Plan: Ambulatory referral to Cardiology; Likely exacerbated by her pregnancy, but warrants additional evaluation.  In the interim, she'll wear support stockings, at least on the RIGHT.  Pregnancy - Plan: Ambulatory referral to Obstetrics / Gynecology  Fernande Bras, PA-C Physician Assistant-Certified [facility]

## 2013-02-02 NOTE — Patient Instructions (Signed)
Work on reducing the clonazepam, and discontinue it if you can. Continue reducing the neurontin dose, and discontinue it. You may use the Buspar for anxiety while you are pregnant. Wear the stockings, at least on the RIGHT leg.  Daisy Tucker also has a line of compression stockings.  Jobst is also a good brand.

## 2013-02-07 ENCOUNTER — Telehealth: Payer: Self-pay | Admitting: Family Medicine

## 2013-02-07 NOTE — Telephone Encounter (Signed)
Spoke with patient gave her message and she will be checking mail box for letter and will try and print letter off from [facility] CHART if not able to she will give [place] a call back to leave a copy up front for her to pick up

## 2013-02-28 ENCOUNTER — Encounter: Payer: Self-pay | Admitting: Cardiovascular Disease

## 2013-03-01 ENCOUNTER — Ambulatory Visit (INDEPENDENT_AMBULATORY_CARE_PROVIDER_SITE_OTHER): Payer: Medicaid Other | Admitting: Cardiovascular Disease

## 2013-03-01 ENCOUNTER — Encounter: Payer: Self-pay | Admitting: Cardiovascular Disease

## 2013-03-01 VITALS — BP 110/72 | HR 93 | Ht 71.0 in | Wt 230.0 lb

## 2013-03-01 DIAGNOSIS — O0990 Supervision of high risk pregnancy, unspecified, unspecified trimester: Secondary | ICD-10-CM

## 2013-03-01 DIAGNOSIS — Z331 Pregnant state, incidental: Secondary | ICD-10-CM

## 2013-03-01 DIAGNOSIS — Z3490 Encounter for supervision of normal pregnancy, unspecified, unspecified trimester: Secondary | ICD-10-CM

## 2013-03-01 DIAGNOSIS — F411 Generalized anxiety disorder: Secondary | ICD-10-CM

## 2013-03-01 DIAGNOSIS — R609 Edema, unspecified: Secondary | ICD-10-CM

## 2013-03-01 HISTORY — DX: Supervision of high risk pregnancy, unspecified, unspecified trimester: O09.90

## 2013-03-01 NOTE — Progress Notes (Signed)
Patient ID: Daisy Tucker, female   DOB: 1975-09-18, 37 y.o.   MRN: [id number] 37 yo referred for unilateral RLE edema.  Progressive over the past year.  Venous duplex 01/29/13 no DVT no bakers cyst and no mention of reflux.  Has an 87 yo child by normal vaginal delivery. No history of DVT or abdominal surgery Currently 6 months pregnant She indicates history of swollen lymph nodes in right groin.  No previous trauma.  Abnormal periods but no definite history of endometriosis Chronic back pain and anxiety.  ? Seen by Dr Neva Seat before for edema.  No previous cardiac issues. No dyspnea Overweight but has lost lbs over the last year with persistant edema.  No travel outside country. ? Previous drug use and ETOH.  Was on HCTZ earlier in year but stopped and indicated not much benefit   ROS: Denies fever, malais, weight loss, blurry vision, decreased visual acuity, cough, sputum, SOB, hemoptysis, pleuritic pain, palpitaitons, heartburn, abdominal pain, melena, lower extremity edema, claudication, or rash.  All other systems reviewed and negative   General: Affect appropriate Healthy:  appears stated age HEENT: normal Neck supple with no adenopathy JVP normal no bruits no thyromegaly Lungs clear with no wheezing and good diaphragmatic motion Heart:  S1/S2 no murmur,rub, gallop or click PMI normal Abdomen: benighn, BS positve, no tenderness, no AAA no bruit.  No HSM or HJR Distal pulses intact with no bruits RLE with failry classic lymph edema to mid thigh Neuro non-focal Skin warm and dry No muscular weakness  Medications Current Outpatient Prescriptions  Medication Sig Dispense Refill  . busPIRone (BUSPAR) 15 MG tablet Take 1/2 tablet (7.5 mg) twice daily.  Increase by 1/3 tablet (5 mg) every 2-3 days, up to 1 tablet (15 mg) twice daily.  60 tablet  3  . clonazePAM (KLONOPIN) 1 MG tablet TAKE ONE TABLET BY MOUTH THREE TIMES DAILY AS NEEDED.  90 tablet  0  . gabapentin (NEURONTIN) 300 MG  capsule Take 300 mg by mouth every other day.      . ibuprofen (ADVIL,MOTRIN) 800 MG tablet Take 800 mg by mouth 3 (three) times daily.       . Prenatal Vit-Fe Fumarate-FA (PRENATAL MULTIVITAMIN) TABS tablet Take 1 tablet by mouth daily at 12 noon.      . promethazine (PHENERGAN) 25 MG tablet TAKE ONE TABLET BY MOUTH EVERY 8 HOURS AS NEEDED FOR NAUSEA  60 tablet  3   No current facility-administered medications for this visit.    Allergies Asa arthritis strength-antacid and Zofran  Family History: Family History  Problem Relation Age of Onset  . Stroke Father     x 3  . Alcohol abuse Mother     Social History: History   Social History  . Marital Status: Married    Spouse Name: Gaylyn Rong    Number of Children: 1  . Years of Education: 13   Occupational History  . self-employed     English as a second language teacher   Social History Main Topics  . Smoking status: Former Smoker    Types: Cigarettes  . Smokeless tobacco: Never Used     Comment: patient is on electronic cigarette ; she is trying to quit.  . Alcohol Use: No  . Drug Use: No     Comment: Pt states she is not currently using anything  . Sexual Activity: Yes    Partners: Male    Birth Control/ Protection: None     Comment: desires pregnancy  Other Topics Concern  . Not on file   Social History Narrative   Married Gaylyn Rong 12/24/2007. His son lives with his mother.   Daughter, Sherian Maroon, born 03/22/2004 from a previous relationship, lives with them.    Electrocardiogram:  NSR normal ECG no RV strain  Assessment and Plan

## 2013-03-01 NOTE — Patient Instructions (Addendum)
Your physician recommends that you schedule a follow-up appointment in:  AS NEEDED   Your physician has requested that you have an echocardiogram. Echocardiography is a painless test that uses sound waves to create images of your heart. It provides your doctor with information about the size and shape of your heart and how well your heart's chambers and valves are working. This procedure takes approximately one hour. There are no restrictions for this procedure.  [place] [facility] DR Coralie Carpen    [address] [place]  [address]

## 2013-03-01 NOTE — Assessment & Plan Note (Signed)
May exacerbate edema. F/U ob/gyn for prenatal care and possible future w/u of endometriosis

## 2013-03-01 NOTE — Assessment & Plan Note (Signed)
On multiple meds in past.  Her lymph edema causes a lot of her concerns.  Also chronic pain in back.  F/U with primary Seems compensated at this time

## 2013-03-01 NOTE — Assessment & Plan Note (Signed)
This is not a cardiac issue and not likely venous.  Etiology of lymphedema not clear.  W/U hindered currently by pregnancy.  Will need an abdominal and pelvic CT when post partum  Suggested to her that she seek opinion at tertiary center or vein clinic that may have experience with lymph edema.  She has support hose and was encouraged to where them all the time on the RLE.  Will order echo to assess RV and LV function but unilateralty of edema makes cardiac cause very unlikely.

## 2013-03-03 ENCOUNTER — Encounter (HOSPITAL_COMMUNITY): Payer: Self-pay

## 2013-03-03 ENCOUNTER — Inpatient Hospital Stay (HOSPITAL_COMMUNITY)
Admission: AD | Admit: 2013-03-03 | Discharge: 2013-03-03 | Disposition: A | Discharge status: Home / Self Care | Payer: Medicaid Other | Source: Ambulatory Visit | Attending: Obstetrics and Gynecology | Admitting: Obstetrics and Gynecology

## 2013-03-03 DIAGNOSIS — Z3482 Encounter for supervision of other normal pregnancy, second trimester: Secondary | ICD-10-CM

## 2013-03-03 DIAGNOSIS — O368190 Decreased fetal movements, unspecified trimester, not applicable or unspecified: Secondary | ICD-10-CM | POA: Insufficient documentation

## 2013-03-03 DIAGNOSIS — R109 Unspecified abdominal pain: Secondary | ICD-10-CM | POA: Insufficient documentation

## 2013-03-03 DIAGNOSIS — F411 Generalized anxiety disorder: Secondary | ICD-10-CM

## 2013-03-03 DIAGNOSIS — IMO0002 Reserved for concepts with insufficient information to code with codable children: Secondary | ICD-10-CM | POA: Insufficient documentation

## 2013-03-03 DIAGNOSIS — O0932 Supervision of pregnancy with insufficient antenatal care, second trimester: Secondary | ICD-10-CM

## 2013-03-03 DIAGNOSIS — R1084 Generalized abdominal pain: Secondary | ICD-10-CM

## 2013-03-03 HISTORY — DX: Gastro-esophageal reflux disease without esophagitis: K21.9

## 2013-03-03 LAB — URINALYSIS, ROUTINE W REFLEX MICROSCOPIC
Bilirubin Urine: NEGATIVE
Glucose, UA: NEGATIVE mg/dL
Hgb urine dipstick: NEGATIVE
Ketones, ur: NEGATIVE mg/dL
Leukocytes, UA: NEGATIVE
Nitrite: NEGATIVE
Protein, ur: NEGATIVE mg/dL
Specific Gravity, Urine: 1.02 (ref 1.005–1.030)
Urobilinogen, UA: 0.2 mg/dL (ref 0.0–1.0)
pH: 7 (ref 5.0–8.0)

## 2013-03-03 NOTE — MAU Provider Note (Signed)
Chief Complaint:  Decreased Fetal Movement, Leg Swelling and Abdominal Cramping   Daisy Tucker is a 37 y.o.  G3P1011 with IUP at [redacted]w[redacted]d presenting for Decreased Fetal Movement, Leg Swelling and Abdominal Cramping .  Pt presents today because she has not had any PNC this pregnancy. She is concerned because she has not had any care and wanted to make sure her baby is ok. Pt is totally unsure of her period but is guessing it would put her around 24 weeks now.  No VB, LOF.  Has had FM for 1.25months per report. States over the last week or so, she hasn't noticed the baby moving as much as earlier.  She is wondering if there is something wrong with the baby's anatomy. However, since being on the monitor, the baby is moving normally again.    Also has a hx of chronic RLE edema. Has been ruled out for DVT and told it was chronic lymphedema. Over the last few weeks it ahs worsened some as her belly has grown.  States she also notices some pulling in her groin with long periods of standing or walking.   No fevers, chills, nausea, vomiting, diarrhea, constipation, cp, sob.   Pt has had no PNC as states she was waiting for insurance. Has an appt at the [facility] on 03/16/13 to start care.  No complications thus far this pregnancy. Does have a hx of drug abuse that she admitted to the nurse although denied to me and has a hx of anxiety for which she is on chronic klonopin.     Menstrual History: OB History   Grav Para Term Preterm Abortions TAB SAB Ect Mult Living   3 1 1  0 1 1 0 0 0 1     G1- NSVD, 9 years agp G2- TAB G3- current.    Patient's last menstrual period was 09/17/2012.      Past Medical History  Diagnosis Date  . Anxiety   . Miscarriage     Rh negative  . Nausea   . GERD (gastroesophageal reflux disease)     Past Surgical History  Procedure Laterality Date  . Spine surgery  2008    L4-5 disc reconstruction    Family History  Problem Relation Age of Onset  . Stroke  Father     x 3  . Alcohol abuse Mother     History  Substance Use Topics  . Smoking status: Former Smoker    Types: Cigarettes  . Smokeless tobacco: Never Used     Comment: patient is on electronic cigarette ; she is trying to quit.  . Alcohol Use: No      Allergies  Allergen Reactions  . Asa Arthritis Strength-Antacid [Aspirin Buffered] Other (See Comments)    Cramps  . Zofran Hives and Swelling    Prescriptions prior to admission  Medication Sig Dispense Refill  . clonazePAM (KLONOPIN) 1 MG tablet TAKE ONE TABLET BY MOUTH THREE TIMES DAILY AS NEEDED.  90 tablet  0  . gabapentin (NEURONTIN) 300 MG capsule Take 300 mg by mouth every other day.      . ibuprofen (ADVIL,MOTRIN) 600 MG tablet Take 600 mg by mouth every 6 (six) hours as needed for pain.      . Prenatal Vit-Fe Fumarate-FA (PRENATAL MULTIVITAMIN) TABS tablet Take 1 tablet by mouth daily at 12 noon.      . promethazine (PHENERGAN) 25 MG tablet TAKE ONE TABLET BY MOUTH EVERY 8 HOURS AS NEEDED FOR NAUSEA  60 tablet  3    Review of Systems - Negative except for what is mentioned in HPI.  Physical Exam  Blood pressure 110/74, pulse 87, temperature 98.2 F (36.8 C), resp. rate 20, height 5\' 10"  (1.778 m), weight 104.055 kg (229 lb 6.4 oz), last menstrual period 09/17/2012, SpO2 100.00%. GENERAL: Well-developed, well-nourished female in no acute distress.  LUNGS: Clear to auscultation bilaterally.  HEART: Regular rate and rhythm. ABDOMEN: Soft, nontender, nondistended, gravid.  EXTREMITIES: Nontender, 2+ distal pulses.compression stocking on right leg and swelling controlled in compression. No pain or skin changes.  FHT:  Baseline rate 140s bpm   Variability moderate to minimal  Accelerations present  10x10.  Contractions: none   Labs: Results for orders placed during the hospital encounter of 03/03/13 (from the past 24 hour(s))  URINALYSIS, ROUTINE W REFLEX MICROSCOPIC   Collection Time    03/03/13  2:20 PM       Result Value Range   Color, Urine YELLOW  YELLOW   APPearance CLEAR  CLEAR   Specific Gravity, Urine 1.020  1.005 - 1.030   pH 7.0  5.0 - 8.0   Glucose, UA NEGATIVE  NEGATIVE mg/dL   Hgb urine dipstick NEGATIVE  NEGATIVE   Bilirubin Urine NEGATIVE  NEGATIVE   Ketones, ur NEGATIVE  NEGATIVE mg/dL   Protein, ur NEGATIVE  NEGATIVE mg/dL   Urobilinogen, UA 0.2  0.0 - 1.0 mg/dL   Nitrite NEGATIVE  NEGATIVE   Leukocytes, UA NEGATIVE  NEGATIVE    Imaging Studies:  No results found.  Assessment: Daisy Tucker is  37 y.o. G3P1011 at [redacted]w[redacted]d presents with Decreased Fetal Movement, Leg Swelling and Abdominal Cramping .  Plan: 1) decreased FM - FHT reassuring given gestational age - now moving normally per pt report - reassurance given and discussed normal fetal movement.   2) leg swelling - chronic. Actually improved now in last two  Days per report from compression stocking - will cont to monitor but given has already had a r/o of DVT and swelling now improved with compression alone, hold off on further workup at this time.   3) cramping - likely round ligament pain - no contractions on TOCO  4) keep appt with [facility] on 10/30. Will order outpatient anatomy and dating scan prior to appt with clinic. PT measuring 25 weeks so dating seems accurate.   Tanika Bracco L 10/17/20143:09 PM

## 2013-03-03 NOTE — MAU Note (Signed)
Patient states she has had no prenatal care. States she has had increasing swelling in the right leg. States she has not felt fetal movement lately and is having lower abdominal cramping. Denies bleeding or leaking. Fetal heart rate in triage in the 140's.

## 2013-03-06 ENCOUNTER — Ambulatory Visit (HOSPITAL_COMMUNITY): Admit: 2013-03-06 | Payer: Medicaid Other

## 2013-03-06 ENCOUNTER — Ambulatory Visit (HOSPITAL_COMMUNITY)
Admit: 2013-03-06 | Discharge: 2013-03-06 | Disposition: A | Discharge status: Home / Self Care | Payer: Medicaid Other | Attending: Family Medicine | Admitting: Family Medicine

## 2013-03-06 ENCOUNTER — Telehealth: Payer: Self-pay

## 2013-03-06 DIAGNOSIS — Z3482 Encounter for supervision of other normal pregnancy, second trimester: Secondary | ICD-10-CM

## 2013-03-06 DIAGNOSIS — O0932 Supervision of pregnancy with insufficient antenatal care, second trimester: Secondary | ICD-10-CM

## 2013-03-06 NOTE — Telephone Encounter (Signed)
Has she discussed the use of clonazepam with her OB?  She was to be tapering off.  Is she taking the buspar?

## 2013-03-06 NOTE — MAU Provider Note (Signed)
Attestation of Attending Supervision of Advanced Practitioner (CNM/NP): Evaluation and management procedures were performed by the Advanced Practitioner under my supervision and collaboration.  I have reviewed the Advanced Practitioner's note and chart, and I agree with the management and plan.  Tiffani Kadow 03/06/2013 2:40 PM   

## 2013-03-06 NOTE — Telephone Encounter (Signed)
Pt is wanting 10 day refill on her Klonopin that will last her till her apt that she has at another clinic  Call back number is [phone]

## 2013-03-07 MED ORDER — CLONAZEPAM 1 MG PO TABS: ORAL_TABLET | ORAL | Status: DC

## 2013-03-07 NOTE — Telephone Encounter (Signed)
She states she has decreased to 2 and 1/2 per day and now will decrease to 2 daily, she was encouraged to begin the Buspar which is safe for pregnancy.

## 2013-03-07 NOTE — Telephone Encounter (Signed)
As she and I discussed, buspar is SAFE during pregnancy, but clonazepam has evidence of human fetal risk. I do not want her to stop it abruptly, which is why she was to taper off, and add the Buspar to control her anxiety symptoms.  I've printed a new prescription for #20 tablets. That's a reduced schedule, from TID to BID, and enough for 10 days.  Please encourage her to start the Buspar.

## 2013-03-07 NOTE — Telephone Encounter (Signed)
Called patient and she has not been seen at the clinic yet, has appt for October 30th. She has had her [place] and is having a girl. She indicates she has not been taking the buspar.

## 2013-03-10 ENCOUNTER — Encounter: Payer: Self-pay | Admitting: Family Medicine

## 2013-03-16 ENCOUNTER — Ambulatory Visit (INDEPENDENT_AMBULATORY_CARE_PROVIDER_SITE_OTHER): Payer: Medicaid Other | Admitting: Advanced Practice Midwife

## 2013-03-16 ENCOUNTER — Other Ambulatory Visit (HOSPITAL_COMMUNITY)
Admission: RE | Admit: 2013-03-16 | Discharge: 2013-03-16 | Disposition: A | Discharge status: Home / Self Care | Payer: Medicaid Other | Source: Ambulatory Visit | Attending: Obstetrics & Gynecology | Admitting: Obstetrics & Gynecology

## 2013-03-16 ENCOUNTER — Encounter: Payer: Self-pay | Admitting: Advanced Practice Midwife

## 2013-03-16 VITALS — BP 120/86 | Wt 224.7 lb

## 2013-03-16 DIAGNOSIS — N760 Acute vaginitis: Secondary | ICD-10-CM | POA: Insufficient documentation

## 2013-03-16 DIAGNOSIS — O99320 Drug use complicating pregnancy, unspecified trimester: Secondary | ICD-10-CM

## 2013-03-16 DIAGNOSIS — F1910 Other psychoactive substance abuse, uncomplicated: Secondary | ICD-10-CM

## 2013-03-16 DIAGNOSIS — O09529 Supervision of elderly multigravida, unspecified trimester: Secondary | ICD-10-CM

## 2013-03-16 DIAGNOSIS — Z3490 Encounter for supervision of normal pregnancy, unspecified, unspecified trimester: Secondary | ICD-10-CM

## 2013-03-16 DIAGNOSIS — Z1151 Encounter for screening for human papillomavirus (HPV): Secondary | ICD-10-CM | POA: Insufficient documentation

## 2013-03-16 DIAGNOSIS — O0930 Supervision of pregnancy with insufficient antenatal care, unspecified trimester: Secondary | ICD-10-CM

## 2013-03-16 DIAGNOSIS — O99340 Other mental disorders complicating pregnancy, unspecified trimester: Secondary | ICD-10-CM

## 2013-03-16 DIAGNOSIS — Z113 Encounter for screening for infections with a predominantly sexual mode of transmission: Secondary | ICD-10-CM | POA: Insufficient documentation

## 2013-03-16 DIAGNOSIS — Z01419 Encounter for gynecological examination (general) (routine) without abnormal findings: Secondary | ICD-10-CM | POA: Insufficient documentation

## 2013-03-16 DIAGNOSIS — O0932 Supervision of pregnancy with insufficient antenatal care, second trimester: Secondary | ICD-10-CM

## 2013-03-16 LAB — POCT URINALYSIS DIP (DEVICE)
Bilirubin Urine: NEGATIVE
Glucose, UA: NEGATIVE mg/dL
Hgb urine dipstick: NEGATIVE
Ketones, ur: NEGATIVE mg/dL
Leukocytes, UA: NEGATIVE
Nitrite: NEGATIVE
Protein, ur: NEGATIVE mg/dL
Specific Gravity, Urine: 1.015 (ref 1.005–1.030)
Urobilinogen, UA: 0.2 mg/dL (ref 0.0–1.0)
pH: 6.5 (ref 5.0–8.0)

## 2013-03-16 MED ORDER — CLONAZEPAM 1 MG PO TABS: ORAL_TABLET | ORAL | Status: DC

## 2013-03-16 MED ORDER — NYSTATIN 100000 UNIT/GM EX POWD: 1.0000 g | Freq: Two times a day (BID) | CUTANEOUS | Status: DC

## 2013-03-16 NOTE — Progress Notes (Signed)
Pulse: 86

## 2013-03-16 NOTE — Progress Notes (Signed)
U/S scheduled 03/24/13 at 215 pm.

## 2013-03-17 LAB — OBSTETRIC PANEL
Antibody Screen: NEGATIVE
Basophils Absolute: 0 10*3/uL (ref 0.0–0.1)
Basophils Relative: 0 % (ref 0–1)
Eosinophils Absolute: 0.1 10*3/uL (ref 0.0–0.7)
Eosinophils Relative: 1 % (ref 0–5)
HCT: 33.5 % — ABNORMAL LOW (ref 36.0–46.0)
Hemoglobin: 12.1 g/dL (ref 12.0–15.0)
Hepatitis B Surface Ag: NEGATIVE
Lymphocytes Relative: 18 % (ref 12–46)
Lymphs Abs: 1.4 10*3/uL (ref 0.7–4.0)
MCH: 31.2 pg (ref 26.0–34.0)
MCHC: 36.1 g/dL — ABNORMAL HIGH (ref 30.0–36.0)
MCV: 86.3 fL (ref 78.0–100.0)
Monocytes Absolute: 0.3 10*3/uL (ref 0.1–1.0)
Monocytes Relative: 4 % (ref 3–12)
Neutro Abs: 6.1 10*3/uL (ref 1.7–7.7)
Neutrophils Relative %: 77 % (ref 43–77)
Platelets: 229 10*3/uL (ref 150–400)
RBC: 3.88 MIL/uL (ref 3.87–5.11)
RDW: 12.7 % (ref 11.5–15.5)
Rh Type: NEGATIVE
Rubella: 2.3 Index — ABNORMAL HIGH (ref <0.90)
WBC: 7.9 10*3/uL (ref 4.0–10.5)

## 2013-03-17 LAB — GLUCOSE TOLERANCE, 1 HOUR (50G) W/O FASTING: Glucose, 1 Hour GTT: 107 mg/dL (ref 70–140)

## 2013-03-17 LAB — ALCOHOL METABOLITE (ETG), URINE: Ethyl Glucuronide (EtG): NEGATIVE ng/mL

## 2013-03-17 LAB — HIV ANTIBODY (ROUTINE TESTING W REFLEX): HIV: NONREACTIVE

## 2013-03-18 ENCOUNTER — Encounter: Payer: Self-pay | Admitting: Advanced Practice Midwife

## 2013-03-18 DIAGNOSIS — O09529 Supervision of elderly multigravida, unspecified trimester: Secondary | ICD-10-CM | POA: Insufficient documentation

## 2013-03-18 LAB — CULTURE, OB URINE
Colony Count: NO GROWTH
Organism ID, Bacteria: NO GROWTH

## 2013-03-18 NOTE — Progress Notes (Signed)
Subjective:    Daisy Tucker is a [id number] [redacted]w[redacted]d being seen today for her first obstetrical visit.  Her obstetrical history is significant for advanced maternal age and NSVD x1 at term. Pt has anxiety, has tried multiple anxiety meds/anti-depression meds, takes Klonopin only for anxiety now.  Was prescribed Buspar by her primary care doctor recently but has not started taking it.  Her doctor will not renew prescription for Klonopin because she is pregnant.  Patient does intend to breast feed. Pregnancy history fully reviewed.  Patient reports no complaints.  Filed Vitals:   03/16/13 0952  BP: 120/86  Weight: 101.923 kg (224 lb 11.2 oz)    HISTORY: OB History  Gravida Para Term Preterm AB SAB TAB Ectopic Multiple Living  3 1 1  0 1 0 1 0 0 1    # Outcome Date GA Lbr Len/2nd Weight Sex Delivery Anes PTL Lv  3 CUR           2 TRM 03/22/04 [redacted]w[redacted]d   F SVD None N Y  1 TAB              Past Medical History  Diagnosis Date  . Anxiety   . Miscarriage     Rh negative  . Nausea   . GERD (gastroesophageal reflux disease)    Past Surgical History  Procedure Laterality Date  . Spine surgery  2008    L4-5 disc reconstruction   Family History  Problem Relation Age of Onset  . Stroke Father     x 3  . Alcohol abuse Mother      Exam    Uterus:  Fundal Height: 28 cm  Pelvic Exam:    Perineum: No Hemorrhoids   Vulva: normal   Vagina:  normal mucosa, normal discharge   pH:    Cervix: multiparous appearance, no bleeding following Pap and no lesions   Adnexa: normal adnexa and no mass, fullness, tenderness   Bony Pelvis: average  System: Breast:  normal appearance, no masses or tenderness   Skin: normal coloration and turgor, no rashes    Neurologic: oriented, normal, gait normal; reflexes normal and symmetric   Extremities: normal strength, tone, and muscle mass, ROM of all joints is normal   HEENT neck supple with midline trachea and thyroid without masses   Mouth/Teeth  mucous membranes moist, pharynx normal without lesions   Neck supple and no masses   Cardiovascular: regular rate and rhythm   Respiratory:  appears well, vitals normal, no respiratory distress, acyanotic, normal RR, ear and throat exam is normal, neck free of mass or lymphadenopathy, chest clear, no wheezing, crepitations, rhonchi, normal symmetric air entry   Abdomen: soft, non-tender; bowel sounds normal; no masses,  no organomegaly   Urinary: urethral meatus normal      Assessment:    Pregnancy: [id number] Patient Active Problem List   Diagnosis Date Noted  . Pregnancy 03/01/2013    Priority: High  . Edema 01/05/2013  . Low back pain radiating to right leg 09/29/2012  . GAD (generalized anxiety disorder) 02/04/2012        Plan:     Initial labs drawn. Prenatal vitamins. Encouraged pt to try Buspar TID, reduce Klonapin as needed.  Klonapin prescription renewed x30 tabs.  Referral to psychiatry/behavioral health. Problem list reviewed and updated. Genetic Screening discussed Panorama (late to care, AMA): drawn today.  Ultrasound discussed; fetal survey: ordered.  Follow up in 2 weeks. 50% of 30 min visit spent on counseling  and coordination of care.     LEFTWICH-KIRBY, Nyeem Stoke 03/18/2013

## 2013-03-20 ENCOUNTER — Emergency Department (HOSPITAL_COMMUNITY)
Admission: EM | Admit: 2013-03-20 | Discharge: 2013-03-20 | Disposition: A | Discharge status: Home / Self Care | Payer: Medicaid Other | Attending: Emergency Medicine | Admitting: Emergency Medicine

## 2013-03-20 ENCOUNTER — Emergency Department (HOSPITAL_COMMUNITY): Payer: Medicaid Other

## 2013-03-20 ENCOUNTER — Encounter (HOSPITAL_COMMUNITY): Payer: Self-pay | Admitting: Emergency Medicine

## 2013-03-20 DIAGNOSIS — Z79899 Other long term (current) drug therapy: Secondary | ICD-10-CM | POA: Insufficient documentation

## 2013-03-20 DIAGNOSIS — J069 Acute upper respiratory infection, unspecified: Secondary | ICD-10-CM

## 2013-03-20 DIAGNOSIS — Z87891 Personal history of nicotine dependence: Secondary | ICD-10-CM | POA: Insufficient documentation

## 2013-03-20 DIAGNOSIS — Z8701 Personal history of pneumonia (recurrent): Secondary | ICD-10-CM | POA: Insufficient documentation

## 2013-03-20 DIAGNOSIS — O99340 Other mental disorders complicating pregnancy, unspecified trimester: Secondary | ICD-10-CM | POA: Insufficient documentation

## 2013-03-20 DIAGNOSIS — O9989 Other specified diseases and conditions complicating pregnancy, childbirth and the puerperium: Secondary | ICD-10-CM | POA: Insufficient documentation

## 2013-03-20 DIAGNOSIS — J9801 Acute bronchospasm: Secondary | ICD-10-CM

## 2013-03-20 DIAGNOSIS — Z8719 Personal history of other diseases of the digestive system: Secondary | ICD-10-CM | POA: Insufficient documentation

## 2013-03-20 DIAGNOSIS — F411 Generalized anxiety disorder: Secondary | ICD-10-CM | POA: Insufficient documentation

## 2013-03-20 HISTORY — DX: Pneumonia, unspecified organism: J18.9

## 2013-03-20 MED ORDER — IPRATROPIUM BROMIDE 0.06 % NA SOLN
2.0000 | Freq: Three times a day (TID) | NASAL | Status: DC
  Filled 2013-03-20: qty 15

## 2013-03-20 MED ORDER — ALBUTEROL SULFATE HFA 108 (90 BASE) MCG/ACT IN AERS
1.0000 | INHALATION_SPRAY | Freq: Four times a day (QID) | RESPIRATORY_TRACT | Status: DC | PRN
  Administered 2013-03-20: 2 via RESPIRATORY_TRACT
  Filled 2013-03-20: qty 6.7

## 2013-03-20 MED ORDER — IPRATROPIUM BROMIDE 0.03 % NA SOLN
2.0000 | Freq: Three times a day (TID) | NASAL | Status: DC
  Administered 2013-03-20: 2 via NASAL
  Filled 2013-03-20: qty 30

## 2013-03-20 NOTE — ED Provider Notes (Addendum)
CSN: [id number]     Arrival date & time 03/20/13  4098 History   First MD Initiated Contact with Patient 03/20/13 [phone]     Chief Complaint  Patient presents with  . Shortness of Breath   (Consider location/radiation/quality/duration/timing/severity/associated sxs/prior Treatment) Patient is a 37 y.o. female presenting with shortness of breath. The history is provided by the patient.  Shortness of Breath Severity:  Severe Onset quality:  Sudden Duration:  5 minutes Timing:  Constant Progression:  Resolved Chronicity:  New Context: URI   Context comment:  States URI sx with congestion, rhinorrhea and productive cough for the last 4 days and then when she woke up today her chest was tight, she was wheezing and SOB that has now resolved Relieved by:  Oxygen Worsened by:  Nothing tried Ineffective treatments:  None tried Associated symptoms: cough, sore throat, sputum production and wheezing   Associated symptoms: no abdominal pain, no chest pain, no fever, no hemoptysis and no vomiting   Risk factors comment:  [redacted] weeks pregnant   Past Medical History  Diagnosis Date  . Anxiety   . Miscarriage     Rh negative  . Nausea   . GERD (gastroesophageal reflux disease)   . PNA (pneumonia)    Past Surgical History  Procedure Laterality Date  . Spine surgery  2008    L4-5 disc reconstruction   Family History  Problem Relation Age of Onset  . Stroke Father     x 3  . Alcohol abuse Mother    History  Substance Use Topics  . Smoking status: Former Smoker    Types: Cigarettes  . Smokeless tobacco: Never Used     Comment: patient is on electronic cigarette ; she is trying to quit.  . Alcohol Use: No   OB History   Grav Para Term Preterm Abortions TAB SAB Ect Mult Living   3 1 1  0 1 1 0 0 0 1     Review of Systems  Constitutional: Negative for fever.  HENT: Positive for sore throat.   Respiratory: Positive for cough, sputum production, shortness of breath and wheezing.  Negative for hemoptysis.   Cardiovascular: Negative for chest pain.  Gastrointestinal: Negative for vomiting and abdominal pain.  All other systems reviewed and are negative.    Allergies  Asa arthritis strength-antacid and Zofran  Home Medications   Current Outpatient Rx  Name  Route  Sig  Dispense  Refill  . clonazePAM (KLONOPIN) 1 MG tablet      TAKE ONE TABLET BY MOUTH TWO TIMES DAILY AS NEEDED.   30 tablet   0   . nystatin (MYCOSTATIN/NYSTOP) 100000 UNIT/GM POWD   Topical   Apply 1 g topically 2 (two) times daily.   30 g   0   . Prenatal Vit-Fe Fumarate-FA (PRENATAL MULTIVITAMIN) TABS tablet   Oral   Take 1 tablet by mouth daily at 12 noon.         . promethazine (PHENERGAN) 25 MG tablet      TAKE ONE TABLET BY MOUTH EVERY 8 HOURS AS NEEDED FOR NAUSEA   60 tablet   3    BP 111/74  Pulse 91  Temp(Src) 98.7 F (37.1 C) (Oral)  Resp 16  SpO2 100%  LMP 09/16/2012 Physical Exam  Nursing note and vitals reviewed. Constitutional: She is oriented to person, place, and time. She appears well-developed and well-nourished. No distress.  HENT:  Head: Normocephalic and atraumatic.  Right Ear: Tympanic membrane and  ear canal normal.  Left Ear: Tympanic membrane and ear canal normal.  Nose: Mucosal edema and rhinorrhea present.  Mouth/Throat: Mucous membranes are normal. Posterior oropharyngeal erythema present. No oropharyngeal exudate, posterior oropharyngeal edema or tonsillar abscesses.  Eyes: Conjunctivae and EOM are normal. Pupils are equal, round, and reactive to light.  Neck: Normal range of motion. Neck supple.  Cardiovascular: Normal rate, regular rhythm and intact distal pulses.   No murmur heard. Pulmonary/Chest: Effort normal and breath sounds normal. No respiratory distress. She has no wheezes. She has no rales.  Abdominal: Soft. She exhibits no distension. There is no tenderness. There is no rebound and no guarding.  Gravid abd between the xiphoid and  umbilicus  Musculoskeletal: Normal range of motion. She exhibits no edema and no tenderness.  Neurological: She is alert and oriented to person, place, and time.  Skin: Skin is warm and dry. No rash noted. No erythema.  Psychiatric: She has a normal mood and affect. Her behavior is normal.    ED Course  Procedures (including critical care time) Labs Review Labs Reviewed - No data to display Imaging Review No results found.  EKG Interpretation   None      EMERGENCY DEPARTMENT [place] PREGNANCY "Study: Limited Ultrasound of the Pelvis"  INDICATIONS:Pregnancy(required) Multiple views of the uterus and pelvic cavity are obtained with a multi-frequency probe.  APPROACH:Transabdominal   PERFORMED BY: Myself  IMAGES ARCHIVED?: No  LIMITATIONS: none  PREGNANCY FREE FLUID: None  PREGNANCY UTERUS FINDINGS:Uterus enlarged ADNEXAL FINDINGS:Left ovary not seen and Right ovary not seen  PREGNANCY FINDINGS: Fetal heart activity seen  INTERPRETATION: Viable intrauterine pregnancy  GESTATIONAL AGE, ESTIMATE: 28 weeks  FETAL HEART RATE: 161  COMMENT(Estimate of Gestational Age):  No acute abnormalities    MDM   1. URI (upper respiratory infection)   2. Bronchospasm     Pt with symptoms consistent with viral URI and bronchial spasm with wheezing this am.  Well appearing here and states breathing is much better.  Pt is [redacted]weeks pregnant with no issues at this time.  Feeling fetal movement and bedside u/s shows normal health IUP with HR of 161.  No signs of breathing difficulty.  No signs of pharyngitis, otitis or abnormal abdominal findings.   CXR pending. Pt given inhaler.  Pt does have hx of leg edema which she states is no worse and had U/S about 1 month ago confirming no DVT and no prior clotting hx.  10:50 AM Pt has decided that she does not want a CXR due to radiation exposure to the fetus.  Discussed with patient that the risk however at this time she anything. She  understands that we cannot 100% rule out community-acquired pneumonia however feel that it is unlikely given patient is afebrile and satting normally. Of albuterol inhaler and atrovent nasal spray for nasal congestion and patient given strict return precautions  10:57 AM Pt now decided she wants CXR CXR wnl.  Pt d/ced hom.e  Gwyneth Sprout, MD 03/20/13 1051  Gwyneth Sprout, MD 03/20/13 1055  Gwyneth Sprout, MD 03/20/13 1055  Gwyneth Sprout, MD 03/21/13 [phone]

## 2013-03-20 NOTE — ED Notes (Signed)
Per EMS pt has SOB that started yesterday afternoon, also complaining of cough, dark sputum, 96% 3L Emma, 94-95% on RA, lungs clear

## 2013-03-20 NOTE — ED Notes (Signed)
Bed: WA17 Expected date:  Expected time:  Means of arrival:  Comments: ems- 37 yo, cough- dark brown sputum

## 2013-03-20 NOTE — ED Notes (Signed)
Patient transported to X-ray 

## 2013-03-21 ENCOUNTER — Other Ambulatory Visit (HOSPITAL_COMMUNITY): Payer: Medicaid Other

## 2013-03-21 LAB — BENZODIAZEPINES (GC/LC/MS), URINE
Alprazolam (GC/LC/MS), ur confirm: NEGATIVE ng/mL
Alprazolam metabolite (GC/LC/MS), ur confirm: NEGATIVE ng/mL
Clonazepam metabolite (GC/LC/MS), ur confirm: 187 ng/mL — AB
Diazepam (GC/LC/MS), ur confirm: NEGATIVE ng/mL
Estazolam (GC/LC/MS), ur confirm: NEGATIVE ng/mL
Flunitrazepam metabolite (GC/LC/MS), ur confirm: NEGATIVE ng/mL
Flurazepam metabolite (GC/LC/MS), ur confirm: NEGATIVE ng/mL
Lorazepam (GC/LC/MS), ur confirm: NEGATIVE ng/mL
Midazolam (GC/LC/MS), ur confirm: NEGATIVE ng/mL
Nordiazepam (GC/LC/MS), ur confirm: NEGATIVE ng/mL
Oxazepam (GC/LC/MS), ur confirm: NEGATIVE ng/mL
Temazepam (GC/LC/MS), ur confirm: NEGATIVE ng/mL
Triazolam metabolite (GC/LC/MS), ur confirm: NEGATIVE ng/mL

## 2013-03-21 LAB — OPIATES/OPIOIDS (LC/MS-MS)
Codeine Urine: NEGATIVE ng/mL
Heroin (6-AM), UR: NEGATIVE ng/mL
Hydrocodone: NEGATIVE ng/mL
Hydromorphone: NEGATIVE ng/mL
Morphine Urine: NEGATIVE ng/mL
Norhydrocodone, Ur: NEGATIVE ng/mL
Noroxycodone, Ur: NEGATIVE ng/mL
Oxycodone, ur: NEGATIVE ng/mL
Oxymorphone: NEGATIVE ng/mL

## 2013-03-22 ENCOUNTER — Emergency Department (HOSPITAL_COMMUNITY)
Admission: EM | Admit: 2013-03-22 | Discharge: 2013-03-22 | Disposition: A | Discharge status: Home / Self Care | Payer: Medicaid Other | Attending: Emergency Medicine | Admitting: Emergency Medicine

## 2013-03-22 ENCOUNTER — Encounter (HOSPITAL_COMMUNITY): Payer: Self-pay | Admitting: Emergency Medicine

## 2013-03-22 DIAGNOSIS — J069 Acute upper respiratory infection, unspecified: Secondary | ICD-10-CM

## 2013-03-22 DIAGNOSIS — Z87891 Personal history of nicotine dependence: Secondary | ICD-10-CM | POA: Insufficient documentation

## 2013-03-22 DIAGNOSIS — R000 Tachycardia, unspecified: Secondary | ICD-10-CM | POA: Insufficient documentation

## 2013-03-22 DIAGNOSIS — Z8701 Personal history of pneumonia (recurrent): Secondary | ICD-10-CM | POA: Insufficient documentation

## 2013-03-22 DIAGNOSIS — Z79899 Other long term (current) drug therapy: Secondary | ICD-10-CM | POA: Insufficient documentation

## 2013-03-22 DIAGNOSIS — F411 Generalized anxiety disorder: Secondary | ICD-10-CM | POA: Insufficient documentation

## 2013-03-22 DIAGNOSIS — Z8719 Personal history of other diseases of the digestive system: Secondary | ICD-10-CM | POA: Insufficient documentation

## 2013-03-22 LAB — URINALYSIS, ROUTINE W REFLEX MICROSCOPIC
Bilirubin Urine: NEGATIVE
Glucose, UA: NEGATIVE mg/dL
Hgb urine dipstick: NEGATIVE
Ketones, ur: NEGATIVE mg/dL
Leukocytes, UA: NEGATIVE
Nitrite: NEGATIVE
Protein, ur: NEGATIVE mg/dL
Specific Gravity, Urine: 1.024 (ref 1.005–1.030)
Urobilinogen, UA: 1 mg/dL (ref 0.0–1.0)
pH: 6.5 (ref 5.0–8.0)

## 2013-03-22 LAB — RAPID STREP SCREEN (MED CTR MEBANE ONLY): Streptococcus, Group A Screen (Direct): NEGATIVE

## 2013-03-22 MED ORDER — SODIUM CHLORIDE 0.9 % IV BOLUS (SEPSIS): 1000.0000 mL | Freq: Once | INTRAVENOUS | Status: DC

## 2013-03-22 MED ORDER — ACETAMINOPHEN 325 MG PO TABS
650.0000 mg | ORAL_TABLET | Freq: Once | ORAL | Status: AC
  Administered 2013-03-22: 650 mg via ORAL
  Filled 2013-03-22: qty 2

## 2013-03-22 NOTE — ED Provider Notes (Signed)
CSN: [id number]     Arrival date & time 03/22/13  1826 History   First MD Initiated Contact with Patient 03/22/13 2002     Chief Complaint  Patient presents with  . Cough  . URI  . Sore Throat   (Consider location/radiation/quality/duration/timing/severity/associated sxs/prior Treatment) HPI Comments: Patient presents emergency department with chief complaint of cough and cold-like symptoms. Also complaining of sore throat and fever. She is [redacted] weeks pregnant. Has normal IUP. Patient states her symptoms began approximately 3 days ago. She was seen 2 days ago [facility] for the same, but states the sore throat has worsened. She is tried taking OTC cough and cold medicine. She has noticed some relief. She states that the pain is moderate. It is worsened with swallowing. She denies any other symptoms. She states that she is able to feel fetal movement.  The history is provided by the patient. No language interpreter was used.    Past Medical History  Diagnosis Date  . Anxiety   . Miscarriage     Rh negative  . Nausea   . GERD (gastroesophageal reflux disease)   . PNA (pneumonia)    Past Surgical History  Procedure Laterality Date  . Spine surgery  2008    L4-5 disc reconstruction   Family History  Problem Relation Age of Onset  . Stroke Father     x 3  . Alcohol abuse Mother    History  Substance Use Topics  . Smoking status: Former Smoker    Types: Cigarettes  . Smokeless tobacco: Never Used     Comment: patient is on electronic cigarette ; she is trying to quit.  . Alcohol Use: No   OB History   Grav Para Term Preterm Abortions TAB SAB Ect Mult Living   3 1 1  0 1 1 0 0 0 1     Review of Systems  All other systems reviewed and are negative.    Allergies  Asa arthritis strength-antacid and Zofran  Home Medications   Current Outpatient Rx  Name  Route  Sig  Dispense  Refill  . clonazePAM (KLONOPIN) 1 MG tablet   Oral   Take 1 mg by mouth 2 (two) times  daily.         Marland Kitchen ibuprofen (ADVIL,MOTRIN) 200 MG tablet   Oral   Take 600-800 mg by mouth every 6 (six) hours as needed for pain.         Marland Kitchen nystatin (MYCOSTATIN/NYSTOP) 100000 UNIT/GM POWD   Topical   Apply 1 g topically 2 (two) times daily as needed (rash).         . Prenatal Vit-Fe Fumarate-FA (PRENATAL MULTIVITAMIN) TABS tablet   Oral   Take 1 tablet by mouth daily at 12 noon.         . promethazine (PHENERGAN) 25 MG tablet   Oral   Take 25 mg by mouth every 8 (eight) hours as needed for nausea.          BP 122/73  Pulse 103  Temp(Src) 98.6 F (37 C) (Oral)  Resp 19  SpO2 99%  LMP 09/16/2012 Physical Exam  Nursing note and vitals reviewed. Constitutional: She is oriented to person, place, and time. She appears well-developed and well-nourished.  HENT:  Head: Normocephalic and atraumatic.  Oropharynx is inflamed, but no exudates, no signs of abscess, airway is intact, uvula is midline  Eyes: Conjunctivae and EOM are normal. Pupils are equal, round, and reactive to light.  Neck:  Normal range of motion. Neck supple.  Cardiovascular: Regular rhythm.  Exam reveals no gallop and no friction rub.   No murmur heard. Slightly tachycardic  Pulmonary/Chest: Effort normal and breath sounds normal. No respiratory distress. She has no wheezes. She has no rales. She exhibits no tenderness.  Abdominal: Soft. Bowel sounds are normal. She exhibits no distension and no mass. There is no tenderness. There is no rebound and no guarding.  Musculoskeletal: Normal range of motion. She exhibits no edema and no tenderness.  Neurological: She is alert and oriented to person, place, and time.  Skin: Skin is warm and dry.  Psychiatric: She has a normal mood and affect. Her behavior is normal. Judgment and thought content normal.    ED Course  Procedures (including critical care time) Results for orders placed during the hospital encounter of 03/22/13  RAPID STREP SCREEN      Result  Value Range   Streptococcus, Group A Screen (Direct) NEGATIVE  NEGATIVE  URINALYSIS, ROUTINE W REFLEX MICROSCOPIC      Result Value Range   Color, Urine YELLOW  YELLOW   APPearance CLEAR  CLEAR   Specific Gravity, Urine 1.024  1.005 - 1.030   pH 6.5  5.0 - 8.0   Glucose, UA NEGATIVE  NEGATIVE mg/dL   Hgb urine dipstick NEGATIVE  NEGATIVE   Bilirubin Urine NEGATIVE  NEGATIVE   Ketones, ur NEGATIVE  NEGATIVE mg/dL   Protein, ur NEGATIVE  NEGATIVE mg/dL   Urobilinogen, UA 1.0  0.0 - 1.0 mg/dL   Nitrite NEGATIVE  NEGATIVE   Leukocytes, UA NEGATIVE  NEGATIVE   Dg Chest 2 View  03/20/2013   CLINICAL DATA:  Shortness of breath. Patient is pregnant and was triple shielded.  EXAM: CHEST  2 VIEW  COMPARISON:  03/05/2011  FINDINGS: Heart size is normal. The lungs are free of focal consolidations and pleural effusions. No pulmonary edema. Visualized osseous structures have a normal appearance.  IMPRESSION: No active cardiopulmonary disease.   Electronically Signed   By: Rosalie Gums M.D.   On: 03/20/2013 11:47   US Ob Comp + 14 Wk  03/06/2013   OBSTETRICAL ULTRASOUND: This exam was performed within a [facility]. The OB US report was generated in the [facility] system, and faxed to the ordering physician.   This report is also available in [employer] and in the [employer]. See [facility] Obstetric US report.    EKG Interpretation   None       MDM   1. URI (upper respiratory infection)     Patient with URI symptoms. She was seen 2 days ago was long for the same. States that the sore throat is worsening. Vitals taken at its 8:00 tonight, do not appear to be accurate. On recheck her vitals are much more reassuring. She is not in any apparent distress.  She can feel fetal movement.  Fetal heart tones are 160.  Patient discussed with Dr. Effie Shy.  Will discharge to home with PCP/OBGYN follow up.  Patient is stable and ready for discharge.  Filed Vitals:    03/22/13 2027  BP:   Pulse: 96  Temp:   Resp:    Temp: 98.6 BP: 122/73    Roxy Horseman, PA-C 03/22/13 2150

## 2013-03-22 NOTE — ED Notes (Signed)
Pt c/o sore throat and cough with nasal congestion and some fever; pt [redacted] weeks pregnant with normal pregnancy; pt seen at [facility] for same

## 2013-03-22 NOTE — ED Notes (Signed)
Pt given d/c instructions and verbalized understanding. NAD at this time. VS are WNL.  

## 2013-03-23 ENCOUNTER — Other Ambulatory Visit: Payer: Self-pay

## 2013-03-23 LAB — BUPRENORPHINES (GC/LC/MS), URINE
Buprenorphine: 147 ng/mL — AB
Norbuprenorphine: 227 ng/mL — AB

## 2013-03-23 NOTE — ED Provider Notes (Signed)
Medical screening examination/treatment/procedure(s) were performed by non-physician practitioner and as supervising physician I was immediately available for consultation/collaboration.  Flint Melter, MD 03/23/13 [phone]

## 2013-03-24 ENCOUNTER — Ambulatory Visit (HOSPITAL_COMMUNITY): Payer: Medicaid Other

## 2013-03-24 ENCOUNTER — Ambulatory Visit (HOSPITAL_COMMUNITY)
Admission: RE | Admit: 2013-03-24 | Discharge: 2013-03-24 | Disposition: A | Discharge status: Home / Self Care | Payer: Medicaid Other | Source: Ambulatory Visit | Attending: Advanced Practice Midwife | Admitting: Advanced Practice Midwife

## 2013-03-24 ENCOUNTER — Ambulatory Visit (HOSPITAL_COMMUNITY): Admit: 2013-03-24 | Payer: Medicaid Other

## 2013-03-24 DIAGNOSIS — O99320 Drug use complicating pregnancy, unspecified trimester: Secondary | ICD-10-CM | POA: Insufficient documentation

## 2013-03-24 DIAGNOSIS — O99340 Other mental disorders complicating pregnancy, unspecified trimester: Secondary | ICD-10-CM | POA: Insufficient documentation

## 2013-03-24 DIAGNOSIS — O09899 Supervision of other high risk pregnancies, unspecified trimester: Secondary | ICD-10-CM | POA: Insufficient documentation

## 2013-03-24 DIAGNOSIS — O09529 Supervision of elderly multigravida, unspecified trimester: Secondary | ICD-10-CM | POA: Insufficient documentation

## 2013-03-24 DIAGNOSIS — O0930 Supervision of pregnancy with insufficient antenatal care, unspecified trimester: Secondary | ICD-10-CM | POA: Insufficient documentation

## 2013-03-24 DIAGNOSIS — F1910 Other psychoactive substance abuse, uncomplicated: Secondary | ICD-10-CM | POA: Insufficient documentation

## 2013-03-24 DIAGNOSIS — Z3490 Encounter for supervision of normal pregnancy, unspecified, unspecified trimester: Secondary | ICD-10-CM

## 2013-03-24 DIAGNOSIS — O0932 Supervision of pregnancy with insufficient antenatal care, second trimester: Secondary | ICD-10-CM

## 2013-03-24 HISTORY — DX: Drug use complicating pregnancy, unspecified trimester: O99.320

## 2013-03-24 LAB — PRESCRIPTION MONITORING PROFILE (19 PANEL)
Amphetamine/Meth: NEGATIVE ng/mL
Barbiturate Screen, Urine: NEGATIVE ng/mL
Cannabinoid Scrn, Ur: NEGATIVE ng/mL
Carisoprodol, Urine: NEGATIVE ng/mL
Cocaine Metabolites: NEGATIVE ng/mL
Creatinine, Urine: 55.28 mg/dL (ref >20.0)
Fentanyl, Ur: NEGATIVE ng/mL
MDMA URINE: NEGATIVE ng/mL
Meperidine, Ur: NEGATIVE ng/mL
Methadone Screen, Urine: NEGATIVE ng/mL
Methaqualone: NEGATIVE ng/mL
Nitrites, Initial: NEGATIVE ug/mL
Oxycodone Screen, Ur: NEGATIVE ng/mL
Phencyclidine, Ur: NEGATIVE ng/mL
Propoxyphene: NEGATIVE ng/mL
Tapentadol, urine: NEGATIVE ng/mL
Tramadol Scrn, Ur: NEGATIVE ng/mL
Zolpidem, Urine: NEGATIVE ng/mL
pH, Initial: 7 pH (ref 4.5–8.9)

## 2013-03-24 LAB — CULTURE, GROUP A STREP

## 2013-03-27 ENCOUNTER — Encounter: Payer: Self-pay | Admitting: Advanced Practice Midwife

## 2013-03-30 ENCOUNTER — Encounter: Payer: Medicaid Other | Admitting: Obstetrics & Gynecology

## 2013-03-31 ENCOUNTER — Ambulatory Visit (INDEPENDENT_AMBULATORY_CARE_PROVIDER_SITE_OTHER): Payer: Medicaid Other | Admitting: Family Medicine

## 2013-03-31 ENCOUNTER — Encounter: Payer: Self-pay | Admitting: Obstetrics & Gynecology

## 2013-03-31 VITALS — BP 127/83 | Temp 98.4°F | Wt 228.6 lb

## 2013-03-31 DIAGNOSIS — O26892 Other specified pregnancy related conditions, second trimester: Secondary | ICD-10-CM | POA: Insufficient documentation

## 2013-03-31 DIAGNOSIS — Z6791 Unspecified blood type, Rh negative: Secondary | ICD-10-CM

## 2013-03-31 DIAGNOSIS — O360990 Maternal care for other rhesus isoimmunization, unspecified trimester, not applicable or unspecified: Secondary | ICD-10-CM

## 2013-03-31 DIAGNOSIS — Z3483 Encounter for supervision of other normal pregnancy, third trimester: Secondary | ICD-10-CM

## 2013-03-31 HISTORY — DX: Unspecified blood type, rh negative: Z67.91

## 2013-03-31 HISTORY — DX: Other specified pregnancy related conditions, second trimester: O26.892

## 2013-03-31 LAB — POCT URINALYSIS DIP (DEVICE)
Bilirubin Urine: NEGATIVE
Glucose, UA: NEGATIVE mg/dL
Hgb urine dipstick: NEGATIVE
Ketones, ur: NEGATIVE mg/dL
Leukocytes, UA: NEGATIVE
Nitrite: NEGATIVE
Protein, ur: NEGATIVE mg/dL
Specific Gravity, Urine: 1.015 (ref 1.005–1.030)
Urobilinogen, UA: 0.2 mg/dL (ref 0.0–1.0)
pH: 7 (ref 5.0–8.0)

## 2013-03-31 MED ORDER — CLONAZEPAM 1 MG PO TABS: 1.0000 mg | ORAL_TABLET | Freq: Two times a day (BID) | ORAL | Status: DC

## 2013-03-31 MED ORDER — NYSTATIN 100000 UNIT/GM EX OINT: 1.0000 "application " | TOPICAL_OINTMENT | Freq: Two times a day (BID) | CUTANEOUS | Status: DC

## 2013-03-31 MED ORDER — RHO D IMMUNE GLOBULIN 1500 UNIT/2ML IJ SOLN: 300.0000 ug | Freq: Once | INTRAMUSCULAR | Status: DC

## 2013-03-31 NOTE — Progress Notes (Signed)
S: pt here for 28 week visit. [id number].   - was seen in the ED last week due to bronchitis and put on flonase and albuterol. Now off both - has a hx of anxiety- gone "through the roof". Was on klonopin and at last visit was told to try the buspar instead. Has been on klonopin and was on buspar but the buspar didn't work at all. Taking klonopin 1mg  BID.  Scared to come off of it.   - knows there are risks to the baby by taking it  O: tearful, anxious   A/P - Anxiety- discussed again risks of benzos in pregnancy. Pt in a state of panic over the thought of running out of her medication. Discussed not safe to come off benzos without a taper. Likely at this point, will need to cont throughout pregnancy and then taper off afterwards. rx of klonopin given for enough until her next appt. Notified pt will need random drug tests. Drug test reviewed from last visit and appropriate.  - intertrigo of the breasts: trial of nystatin ointment as powder isn't staying - PTL precuations discussed - 28 week labs reviewed.  - rhogam- pt initially not willing to get injection but after discussion, willing to come back for her injection next week. Order placed.

## 2013-03-31 NOTE — Progress Notes (Signed)
P= 100   Pt is taking Motrin for back pain, informed to discontinue during pregnancy.  Questions about prescriptions. Refuses vaccines in pregnancy.

## 2013-03-31 NOTE — Patient Instructions (Signed)
Second Trimester of Pregnancy The second trimester is from week 13 through week 28, months 4 through 6. The second trimester is often a time when you feel your best. Your body has also adjusted to being pregnant, and you begin to feel better physically. Usually, morning sickness has lessened or quit completely, you may have more energy, and you may have an increase in appetite. The second trimester is also a time when the fetus is growing rapidly. At the end of the sixth month, the fetus is about 9 inches long and weighs about 1 pounds. You will likely begin to feel the baby move (quickening) between 18 and 20 weeks of the pregnancy. BODY CHANGES Your body goes through many changes during pregnancy. The changes vary from woman to woman.   Your weight will continue to increase. You will notice your lower abdomen bulging out.  You may begin to get stretch marks on your hips, abdomen, and breasts.  You may develop headaches that can be relieved by medicines approved by your caregiver.  You may urinate more often because the fetus is pressing on your bladder.  You may develop or continue to have heartburn as a result of your pregnancy.  You may develop constipation because certain hormones are causing the muscles that push waste through your intestines to slow down.  You may develop hemorrhoids or swollen, bulging veins (varicose veins).  You may have back pain because of the weight gain and pregnancy hormones relaxing your joints between the bones in your pelvis and as a result of a shift in weight and the muscles that support your balance.  Your breasts will continue to grow and be tender.  Your gums may bleed and may be sensitive to brushing and flossing.  Dark spots or blotches (chloasma, mask of pregnancy) may develop on your face. This will likely fade after the baby is born.  A dark line from your belly button to the pubic area (linea nigra) may appear. This will likely fade after the  baby is born. WHAT TO EXPECT AT YOUR PRENATAL VISITS During a routine prenatal visit:  You will be weighed to make sure you and the fetus are growing normally.  Your blood pressure will be taken.  Your abdomen will be measured to track your baby's growth.  The fetal heartbeat will be listened to.  Any test results from the previous visit will be discussed. Your caregiver may ask you:  How you are feeling.  If you are feeling the baby move.  If you have had any abnormal symptoms, such as leaking fluid, bleeding, severe headaches, or abdominal cramping.  If you have any questions. Other tests that may be performed during your second trimester include:  Blood tests that check for:  Low iron levels (anemia).  Gestational diabetes (between 24 and 28 weeks).  Rh antibodies.  Urine tests to check for infections, diabetes, or protein in the urine.  An ultrasound to confirm the proper growth and development of the baby.  An amniocentesis to check for possible genetic problems.  Fetal screens for spina bifida and Down syndrome. HOME CARE INSTRUCTIONS   Avoid all smoking, herbs, alcohol, and unprescribed drugs. These chemicals affect the formation and growth of the baby.  Follow your caregiver's instructions regarding medicine use. There are medicines that are either safe or unsafe to take during pregnancy.  Exercise only as directed by your caregiver. Experiencing uterine cramps is a good sign to stop exercising.  Continue to eat regular,   healthy meals.  Wear a good support bra for breast tenderness.  Do not use hot tubs, steam rooms, or saunas.  Wear your seat belt at all times when driving.  Avoid raw meat, uncooked cheese, cat litter boxes, and soil used by cats. These carry germs that can cause birth defects in the baby.  Take your prenatal vitamins.  Try taking a stool softener (if your caregiver approves) if you develop constipation. Eat more high-fiber foods,  such as fresh vegetables or fruit and whole grains. Drink plenty of fluids to keep your urine clear or pale yellow.  Take warm sitz baths to soothe any pain or discomfort caused by hemorrhoids. Use hemorrhoid cream if your caregiver approves.  If you develop varicose veins, wear support hose. Elevate your feet for 15 minutes, 3 4 times a day. Limit salt in your diet.  Avoid heavy lifting, wear low heel shoes, and practice good posture.  Rest with your legs elevated if you have leg cramps or low back pain.  Visit your dentist if you have not gone yet during your pregnancy. Use a soft toothbrush to brush your teeth and be gentle when you floss.  A sexual relationship may be continued unless your caregiver directs you otherwise.  Continue to go to all your prenatal visits as directed by your caregiver. SEEK MEDICAL CARE IF:   You have dizziness.  You have mild pelvic cramps, pelvic pressure, or nagging pain in the abdominal area.  You have persistent nausea, vomiting, or diarrhea.  You have a bad smelling vaginal discharge.  You have pain with urination. SEEK IMMEDIATE MEDICAL CARE IF:   You have a fever.  You are leaking fluid from your vagina.  You have spotting or bleeding from your vagina.  You have severe abdominal cramping or pain.  You have rapid weight gain or loss.  You have shortness of breath with chest pain.  You notice sudden or extreme swelling of your face, hands, ankles, feet, or legs.  You have not felt your baby move in over an hour.  You have severe headaches that do not go away with medicine.  You have vision changes. Document Released: 04/28/2001 Document Revised: 01/04/2013 Document Reviewed: 07/05/2012 [employer] Patient Information 2014 [employer].  

## 2013-04-06 ENCOUNTER — Encounter: Payer: Medicaid Other | Admitting: Obstetrics and Gynecology

## 2013-04-06 ENCOUNTER — Ambulatory Visit (INDEPENDENT_AMBULATORY_CARE_PROVIDER_SITE_OTHER): Payer: Medicaid Other | Admitting: *Deleted

## 2013-04-06 VITALS — BP 105/68 | HR 103 | Temp 97.0°F | Wt 228.6 lb

## 2013-04-06 DIAGNOSIS — O360120 Maternal care for anti-D [Rh] antibodies, second trimester, not applicable or unspecified: Secondary | ICD-10-CM

## 2013-04-06 DIAGNOSIS — O360990 Maternal care for other rhesus isoimmunization, unspecified trimester, not applicable or unspecified: Secondary | ICD-10-CM

## 2013-04-06 MED ORDER — RHO D IMMUNE GLOBULIN 1500 UNIT/2ML IJ SOLN
300.0000 ug | Freq: Once | INTRAMUSCULAR | Status: AC
  Administered 2013-04-06: 300 ug via INTRAMUSCULAR

## 2013-04-10 ENCOUNTER — Encounter: Payer: Self-pay | Admitting: *Deleted

## 2013-04-19 ENCOUNTER — Ambulatory Visit (INDEPENDENT_AMBULATORY_CARE_PROVIDER_SITE_OTHER): Payer: Medicaid Other | Admitting: Obstetrics and Gynecology

## 2013-04-19 ENCOUNTER — Encounter: Payer: Medicaid Other | Admitting: Obstetrics and Gynecology

## 2013-04-19 VITALS — BP 121/80 | Temp 97.0°F | Wt 242.9 lb

## 2013-04-19 DIAGNOSIS — O99323 Drug use complicating pregnancy, third trimester: Secondary | ICD-10-CM

## 2013-04-19 DIAGNOSIS — F1920 Other psychoactive substance dependence, uncomplicated: Secondary | ICD-10-CM

## 2013-04-19 LAB — POCT URINALYSIS DIP (DEVICE)
Bilirubin Urine: NEGATIVE
Glucose, UA: NEGATIVE mg/dL
Hgb urine dipstick: NEGATIVE
Ketones, ur: NEGATIVE mg/dL
Leukocytes, UA: NEGATIVE
Nitrite: NEGATIVE
Protein, ur: NEGATIVE mg/dL
Specific Gravity, Urine: 1.015 (ref 1.005–1.030)
Urobilinogen, UA: 0.2 mg/dL (ref 0.0–1.0)
pH: 7 (ref 5.0–8.0)

## 2013-04-19 MED ORDER — CLONAZEPAM 1 MG PO TABS: 1.0000 mg | ORAL_TABLET | Freq: Two times a day (BID) | ORAL | Status: DC

## 2013-04-19 MED ORDER — PROMETHAZINE HCL 25 MG PO TABS: 25.0000 mg | ORAL_TABLET | Freq: Three times a day (TID) | ORAL | Status: DC | PRN

## 2013-04-19 NOTE — Progress Notes (Signed)
Anxiety/panic attacks controlled with Klonopin>refilled RX. F/U growth scan scheduled.Denies respiratory sx. Heartburn, nausea  and reflux though on Pepcid (not qd). Advised protonix or Pepcid qd regimen and Phenergan occasional use if nausea severe. Has stopped Motrin. Right LE edema predates pregnancy "vascular problem" and plans specialty visit after pregnancy. Now has bilat pitting to B/K. Advised compression stockings, elevation.

## 2013-04-19 NOTE — Patient Instructions (Signed)
Third Trimester of Pregnancy  The third trimester is from week 29 through week 42, months 7 through 9. The third trimester is a time when the fetus is growing rapidly. At the end of the ninth month, the fetus is about 20 inches in length and weighs 6 10 pounds.   BODY CHANGES  Your body goes through many changes during pregnancy. The changes vary from woman to woman.    Your weight will continue to increase. You can expect to gain 25 35 pounds (11 16 kg) by the end of the pregnancy.   You may begin to get stretch marks on your hips, abdomen, and breasts.   You may urinate more often because the fetus is moving lower into your pelvis and pressing on your bladder.   You may develop or continue to have heartburn as a result of your pregnancy.   You may develop constipation because certain hormones are causing the muscles that push waste through your intestines to slow down.   You may develop hemorrhoids or swollen, bulging veins (varicose veins).   You may have pelvic pain because of the weight gain and pregnancy hormones relaxing your joints between the bones in your pelvis. Back aches may result from over exertion of the muscles supporting your posture.   Your breasts will continue to grow and be tender. A yellow discharge may leak from your breasts called colostrum.   Your belly button may stick out.   You may feel short of breath because of your expanding uterus.   You may notice the fetus "dropping," or moving lower in your abdomen.   You may have a bloody mucus discharge. This usually occurs a few days to a week before labor begins.   Your cervix becomes thin and soft (effaced) near your due date.  WHAT TO EXPECT AT YOUR PRENATAL EXAMS   You will have prenatal exams every 2 weeks until week 36. Then, you will have weekly prenatal exams. During a routine prenatal visit:   You will be weighed to make sure you and the fetus are growing normally.   Your blood pressure is taken.   Your abdomen will be  measured to track your baby's growth.   The fetal heartbeat will be listened to.   Any test results from the previous visit will be discussed.   You may have a cervical check near your due date to see if you have effaced.  At around 36 weeks, your caregiver will check your cervix. At the same time, your caregiver will also perform a test on the secretions of the vaginal tissue. This test is to determine if a type of bacteria, Group B streptococcus, is present. Your caregiver will explain this further.  Your caregiver may ask you:   What your birth plan is.   How you are feeling.   If you are feeling the baby move.   If you have had any abnormal symptoms, such as leaking fluid, bleeding, severe headaches, or abdominal cramping.   If you have any questions.  Other tests or screenings that may be performed during your third trimester include:   Blood tests that check for low iron levels (anemia).   Fetal testing to check the health, activity level, and growth of the fetus. Testing is done if you have certain medical conditions or if there are problems during the pregnancy.  FALSE LABOR  You may feel small, irregular contractions that eventually go away. These are called Braxton Hicks contractions, or   false labor. Contractions may last for hours, days, or even weeks before true labor sets in. If contractions come at regular intervals, intensify, or become painful, it is best to be seen by your caregiver.   SIGNS OF LABOR    Menstrual-like cramps.   Contractions that are 5 minutes apart or less.   Contractions that start on the top of the uterus and spread down to the lower abdomen and back.   A sense of increased pelvic pressure or back pain.   A watery or bloody mucus discharge that comes from the vagina.  If you have any of these signs before the 37th week of pregnancy, call your caregiver right away. You need to go to the hospital to get checked immediately.  HOME CARE INSTRUCTIONS    Avoid all  smoking, herbs, alcohol, and unprescribed drugs. These chemicals affect the formation and growth of the baby.   Follow your caregiver's instructions regarding medicine use. There are medicines that are either safe or unsafe to take during pregnancy.   Exercise only as directed by your caregiver. Experiencing uterine cramps is a good sign to stop exercising.   Continue to eat regular, healthy meals.   Wear a good support bra for breast tenderness.   Do not use hot tubs, steam rooms, or saunas.   Wear your seat belt at all times when driving.   Avoid raw meat, uncooked cheese, cat litter boxes, and soil used by cats. These carry germs that can cause birth defects in the baby.   Take your prenatal vitamins.   Try taking a stool softener (if your caregiver approves) if you develop constipation. Eat more high-fiber foods, such as fresh vegetables or fruit and whole grains. Drink plenty of fluids to keep your urine clear or pale yellow.   Take warm sitz baths to soothe any pain or discomfort caused by hemorrhoids. Use hemorrhoid cream if your caregiver approves.   If you develop varicose veins, wear support hose. Elevate your feet for 15 minutes, 3 4 times a day. Limit salt in your diet.   Avoid heavy lifting, wear low heal shoes, and practice good posture.   Rest a lot with your legs elevated if you have leg cramps or low back pain.   Visit your dentist if you have not gone during your pregnancy. Use a soft toothbrush to brush your teeth and be gentle when you floss.   A sexual relationship may be continued unless your caregiver directs you otherwise.   Do not travel far distances unless it is absolutely necessary and only with the approval of your caregiver.   Take prenatal classes to understand, practice, and ask questions about the labor and delivery.   Make a trial run to the hospital.   Pack your hospital bag.   Prepare the baby's nursery.   Continue to go to all your prenatal visits as directed  by your caregiver.  SEEK MEDICAL CARE IF:   You are unsure if you are in labor or if your water has broken.   You have dizziness.   You have mild pelvic cramps, pelvic pressure, or nagging pain in your abdominal area.   You have persistent nausea, vomiting, or diarrhea.   You have a bad smelling vaginal discharge.   You have pain with urination.  SEEK IMMEDIATE MEDICAL CARE IF:    You have a fever.   You are leaking fluid from your vagina.   You have spotting or bleeding from your vagina.     You have severe abdominal cramping or pain.   You have rapid weight loss or gain.   You have shortness of breath with chest pain.   You notice sudden or extreme swelling of your face, hands, ankles, feet, or legs.   You have not felt your baby move in over an hour.   You have severe headaches that do not go away with medicine.   You have vision changes.  Document Released: 04/28/2001 Document Revised: 01/04/2013 Document Reviewed: 07/05/2012  Exit[employer] Patient Information 2014 [employer].

## 2013-04-19 NOTE — Progress Notes (Signed)
Pulse- 104 14 lb weight gain since visit 11/14

## 2013-04-24 ENCOUNTER — Ambulatory Visit (HOSPITAL_COMMUNITY): Payer: Medicaid Other | Attending: Obstetrics and Gynecology

## 2013-05-04 ENCOUNTER — Ambulatory Visit (INDEPENDENT_AMBULATORY_CARE_PROVIDER_SITE_OTHER): Payer: Medicaid Other | Admitting: Obstetrics & Gynecology

## 2013-05-04 VITALS — BP 139/94 | Wt 238.3 lb

## 2013-05-04 DIAGNOSIS — O99340 Other mental disorders complicating pregnancy, unspecified trimester: Secondary | ICD-10-CM

## 2013-05-04 DIAGNOSIS — O09523 Supervision of elderly multigravida, third trimester: Secondary | ICD-10-CM

## 2013-05-04 DIAGNOSIS — O99323 Drug use complicating pregnancy, third trimester: Secondary | ICD-10-CM

## 2013-05-04 DIAGNOSIS — F1910 Other psychoactive substance abuse, uncomplicated: Secondary | ICD-10-CM

## 2013-05-04 DIAGNOSIS — K219 Gastro-esophageal reflux disease without esophagitis: Secondary | ICD-10-CM | POA: Insufficient documentation

## 2013-05-04 DIAGNOSIS — F1920 Other psychoactive substance dependence, uncomplicated: Secondary | ICD-10-CM

## 2013-05-04 DIAGNOSIS — O09529 Supervision of elderly multigravida, unspecified trimester: Secondary | ICD-10-CM

## 2013-05-04 LAB — POCT URINALYSIS DIP (DEVICE)
Bilirubin Urine: NEGATIVE
Glucose, UA: NEGATIVE mg/dL
Hgb urine dipstick: NEGATIVE
Ketones, ur: NEGATIVE mg/dL
Leukocytes, UA: NEGATIVE
Nitrite: NEGATIVE
Protein, ur: NEGATIVE mg/dL
Specific Gravity, Urine: 1.01 (ref 1.005–1.030)
Urobilinogen, UA: 0.2 mg/dL (ref 0.0–1.0)
pH: 7 (ref 5.0–8.0)

## 2013-05-04 MED ORDER — PANTOPRAZOLE SODIUM 40 MG PO TBEC: 40.0000 mg | DELAYED_RELEASE_TABLET | Freq: Every day | ORAL | Status: DC

## 2013-05-04 NOTE — Progress Notes (Signed)
Rx Protonix for GERD. F/u US rescheduled Cough and congestion, no fever or chest pain, otc cold meds Ok  Repeat BP 123/76

## 2013-05-04 NOTE — Progress Notes (Signed)
Pulse- 112  Edema-right leg Need to get [place] rescheduled

## 2013-05-04 NOTE — Progress Notes (Signed)
U/S scheduled 05/05/13 at 4 pm.

## 2013-05-04 NOTE — Patient Instructions (Signed)
Heartburn During Pregnancy  Heartburn is a burning sensation in the chest caused by stomach acid backing up into the esophagus. Heartburn (also known as "reflux") is common in pregnancy because a certain hormone (progesterone) changes. The progesterone hormone may relax the valve that separates the esophagus from the stomach. This allows acid to go up into the esophagus, causing heartburn. Heartburn may also happen in pregnancy because the enlarging uterus pushes up on the stomach, which pushes more acid into the esophagus. This is especially true in the later stages of pregnancy. Heartburn problems usually go away after giving birth. CAUSES   The progesterone hormone.  Changing hormone levels.  The growing uterus that pushes stomach acid upward.  Large meals.  Certain foods and drinks.  Exercise.  Increased acid production. SYMPTOMS   Burning pain in the chest or lower throat.  Bitter taste in the mouth.  Coughing. DIAGNOSIS  Heartburn is typically diagnosed by your caregiver when taking a careful history of your concern. Your caregiver may order a blood test to check for a certain type of bacteria that is associated with heartburn. Sometimes, heartburn is diagnosed by prescribing a heartburn medicine to see if the symptoms improve. It is rare in pregnancy to have a procedure called an endoscopy. This is when a tube with a light and a camera on the end is used to examine the esophagus and the stomach. TREATMENT   Your caregiver may tell you to use certain over-the-counter medicines (antacids, acid reducers) for mild heartburn.  Your caregiver may prescribe medicines to decrease stomach acid or to protect your stomach lining.  Your caregiver may recommend certain diet changes.  For severe cases, your caregiver may recommend that the head of the bed be elevated on blocks. (Sleeping with more pillows is not an effective treatment as it only changes the position of your head and does  not improve the main problem of stomach acid refluxing into the esophagus.) HOME CARE INSTRUCTIONS   Take all medicines as directed by your caregiver.  Raise the head of your bed by putting blocks under the legs if instructed to by your caregiver.  Do not exercise right after eating.  Avoid eating 2 or 3 hours before bed. Do not lie down right after eating.  Eat small meals throughout the day instead of 3 large meals.  Identify foods and beverages that make your symptoms worse and avoid them. Foods you may want to avoid include:  Peppers.  Chocolate.  High-fat foods, including fried foods.  Spicy foods.  Garlic and onions.  Citrus fruits, including oranges, grapefruit, lemons, and limes.  Food containing tomatoes or tomato products.  Mint.  Carbonated and caffeinated drinks.  Vinegar. SEEK IMMEDIATE MEDICAL CARE IF:   You have severe chest pain that goes down your arm or into your jaw or neck.  You feel sweaty, dizzy, or lightheaded.  You become short of breath.  You vomit blood.  You have difficulty or pain with swallowing.  You have bloody or black, tarry stools.  You have episodes of heartburn more than 3 times a week, for more than 2 weeks. MAKE SURE YOU:  Understand these instructions.  Will watch your condition.  Will get help right away if you are not doing well or get worse. Document Released: 05/01/2000 Document Revised: 07/27/2011 Document Reviewed: 12/21/2012 Exit[employer] Patient Information 2014 [employer].  

## 2013-05-05 ENCOUNTER — Ambulatory Visit (HOSPITAL_COMMUNITY)
Admission: RE | Admit: 2013-05-05 | Discharge: 2013-05-05 | Disposition: A | Discharge status: Home / Self Care | Payer: Medicaid Other | Source: Ambulatory Visit | Attending: Obstetrics & Gynecology | Admitting: Obstetrics & Gynecology

## 2013-05-05 DIAGNOSIS — F1910 Other psychoactive substance abuse, uncomplicated: Secondary | ICD-10-CM | POA: Insufficient documentation

## 2013-05-05 DIAGNOSIS — O09529 Supervision of elderly multigravida, unspecified trimester: Secondary | ICD-10-CM | POA: Insufficient documentation

## 2013-05-05 DIAGNOSIS — O09523 Supervision of elderly multigravida, third trimester: Secondary | ICD-10-CM

## 2013-05-05 DIAGNOSIS — O99323 Drug use complicating pregnancy, third trimester: Secondary | ICD-10-CM

## 2013-05-05 DIAGNOSIS — O99340 Other mental disorders complicating pregnancy, unspecified trimester: Secondary | ICD-10-CM | POA: Insufficient documentation

## 2013-05-05 DIAGNOSIS — O09899 Supervision of other high risk pregnancies, unspecified trimester: Secondary | ICD-10-CM | POA: Insufficient documentation

## 2013-05-05 DIAGNOSIS — O0930 Supervision of pregnancy with insufficient antenatal care, unspecified trimester: Secondary | ICD-10-CM | POA: Insufficient documentation

## 2013-05-05 LAB — ALCOHOL METABOLITE (ETG), URINE

## 2013-05-05 LAB — ETHYL GLUCURONIDE, URINE: Ethyl Glucuronide (EtG): 859 ng/mL — AB

## 2013-05-08 LAB — PRESCRIPTION MONITORING PROFILE (19 PANEL)
Amphetamine/Meth: NEGATIVE ng/mL
Barbiturate Screen, Urine: NEGATIVE ng/mL
Benzodiazepine Screen, Urine: NEGATIVE ng/mL
Cannabinoid Scrn, Ur: NEGATIVE ng/mL
Carisoprodol, Urine: NEGATIVE ng/mL
Cocaine Metabolites: NEGATIVE ng/mL
Creatinine, Urine: 17.54 mg/dL — ABNORMAL LOW (ref >20.0)
Fentanyl, Ur: NEGATIVE ng/mL
MDMA URINE: NEGATIVE ng/mL
Meperidine, Ur: NEGATIVE ng/mL
Methadone Screen, Urine: NEGATIVE ng/mL
Methaqualone: NEGATIVE ng/mL
Nitrites, Initial: NEGATIVE ug/mL
Opiate Screen, Urine: NEGATIVE ng/mL
Oxycodone Screen, Ur: NEGATIVE ng/mL
Phencyclidine, Ur: NEGATIVE ng/mL
Propoxyphene: NEGATIVE ng/mL
Tapentadol, urine: NEGATIVE ng/mL
Tramadol Scrn, Ur: NEGATIVE ng/mL
Zolpidem, Urine: NEGATIVE ng/mL
pH, Initial: 6.9 pH (ref 4.5–8.9)

## 2013-05-08 LAB — BUPRENORPHINES (GC/LC/MS), URINE
Buprenorphine: 103 ng/mL — AB
Norbuprenorphine: 370 ng/mL — AB

## 2013-05-10 ENCOUNTER — Encounter: Payer: Self-pay | Admitting: Obstetrics and Gynecology

## 2013-05-10 ENCOUNTER — Telehealth: Payer: Self-pay | Admitting: *Deleted

## 2013-05-10 NOTE — Telephone Encounter (Signed)
Pt. Came in to clinic today requesting clonazepam refill. Discussed this with Dr. Jolayne Panther who reviewed the prescription which had been ordered on the 12/3. Pt. Was drug screened on 12/18. Screen was negative for benzos. Dr. Jolayne Panther stated she would not refill the prescription and that they can discuss it at her follow up appointment on 05/25/13. Spoke with patient and informed her that we can not refill her prescription today due to the fact that her drug screen was negative which raises our concern that she may not be taking the medication. Pt. Stated that Dr.Beck told her she would not be taken off of it in pregnancy. Informed pt. Again that we cannot refill this prescription as her drug screen was negative. She can discuss it with Dr. Jolayne Panther at her next appointment, be screened again and then discuss options. Pt. Verbalized understanding and stated she doesn't know what she is going to do, she may call back again. Re-itterated that we would not be able to do anything until her follow up. Pt. Verbalized understanding and left.   Pt.'s husband came in angry and requesting to speak with nurse. He left before I could speak with him.   Dr. Jolayne Panther is adamit that this pt. Does not get a refill until another drug screen. She also does not feel that patient should be on this along with Subutex to begin with as the Subutex is to help her come off of the benzo.

## 2013-05-10 NOTE — Progress Notes (Signed)
Patient ID: Daisy Tucker, female   DOB: [birth year], 37 y.o.   MRN: [id number] Patient presented to office without scheduled appointment requesting refill on Clonazepam. Patient last received a refill on 12/3. Patient states that she has been taking it twice daily as prescribes. Her drug screen on 05/04/2013 was negative for that drug. It was positive in October. Patient currently on Subutex for history of substance abuse. In view of recent negative drug screen, patient was advised that no refill would be provided. Patient left office angry and has a follow up appointment with me on 05/25/2013

## 2013-05-10 NOTE — Telephone Encounter (Signed)
Pt called nurse line requesting medication refill. 

## 2013-05-15 ENCOUNTER — Telehealth: Payer: Self-pay | Admitting: *Deleted

## 2013-05-15 ENCOUNTER — Encounter: Payer: Self-pay | Admitting: *Deleted

## 2013-05-15 NOTE — Telephone Encounter (Signed)
Pt  Called nurse line requesting to talk with a physician concerning a misunderstanding with medication refill.

## 2013-05-15 NOTE — Telephone Encounter (Signed)
Patient was contacted by [employer], [place], [place].  Please see notes scanned under media tab.

## 2013-05-18 NOTE — L&D Delivery Note (Signed)
Delivery Note At 12:44 PM a viable and healthy female was delivered via Vaginal, Vacuum (Extractor) (Presentation: ; Occiput Posterior).  APGAR: 8, 9; weight .   Placenta status: Intact, Spontaneous.  Cord: 3 vessels with the following complications: None.  Cord pH: 7.19  Anesthesia: Epidural  Episiotomy: None Lacerations: 2nd degree;Perineal Suture Repair: 3.0 vicryl Est. Blood Loss (mL): 350  Mom to postpartum.  Baby to Couplet care / Skin to Skin. Placento to birthing suites.  Daisy Tucker 07/02/2013, 1:26 PM

## 2013-05-25 ENCOUNTER — Ambulatory Visit (INDEPENDENT_AMBULATORY_CARE_PROVIDER_SITE_OTHER): Payer: Medicaid Other | Admitting: Obstetrics and Gynecology

## 2013-05-25 ENCOUNTER — Encounter: Payer: Self-pay | Admitting: Obstetrics and Gynecology

## 2013-05-25 VITALS — BP 131/88

## 2013-05-25 DIAGNOSIS — M545 Low back pain, unspecified: Secondary | ICD-10-CM

## 2013-05-25 DIAGNOSIS — O09529 Supervision of elderly multigravida, unspecified trimester: Secondary | ICD-10-CM

## 2013-05-25 DIAGNOSIS — Z3490 Encounter for supervision of normal pregnancy, unspecified, unspecified trimester: Secondary | ICD-10-CM

## 2013-05-25 DIAGNOSIS — Z6791 Unspecified blood type, Rh negative: Secondary | ICD-10-CM

## 2013-05-25 DIAGNOSIS — F1910 Other psychoactive substance abuse, uncomplicated: Secondary | ICD-10-CM

## 2013-05-25 DIAGNOSIS — O09523 Supervision of elderly multigravida, third trimester: Secondary | ICD-10-CM

## 2013-05-25 DIAGNOSIS — O26892 Other specified pregnancy related conditions, second trimester: Secondary | ICD-10-CM

## 2013-05-25 DIAGNOSIS — O99320 Drug use complicating pregnancy, unspecified trimester: Secondary | ICD-10-CM

## 2013-05-25 DIAGNOSIS — O360990 Maternal care for other rhesus isoimmunization, unspecified trimester, not applicable or unspecified: Secondary | ICD-10-CM

## 2013-05-25 DIAGNOSIS — F1920 Other psychoactive substance dependence, uncomplicated: Secondary | ICD-10-CM

## 2013-05-25 DIAGNOSIS — O99323 Drug use complicating pregnancy, third trimester: Secondary | ICD-10-CM

## 2013-05-25 DIAGNOSIS — Z331 Pregnant state, incidental: Secondary | ICD-10-CM

## 2013-05-25 DIAGNOSIS — O99340 Other mental disorders complicating pregnancy, unspecified trimester: Secondary | ICD-10-CM

## 2013-05-25 LAB — POCT URINALYSIS DIP (DEVICE)
Bilirubin Urine: NEGATIVE
Glucose, UA: NEGATIVE mg/dL
Hgb urine dipstick: NEGATIVE
Ketones, ur: NEGATIVE mg/dL
Leukocytes, UA: NEGATIVE
Nitrite: NEGATIVE
Protein, ur: NEGATIVE mg/dL
Specific Gravity, Urine: 1.01 (ref 1.005–1.030)
Urobilinogen, UA: 0.2 mg/dL (ref 0.0–1.0)
pH: 6.5 (ref 5.0–8.0)

## 2013-05-25 MED ORDER — ESOMEPRAZOLE MAGNESIUM 20 MG PO CPDR: 20.0000 mg | DELAYED_RELEASE_CAPSULE | Freq: Every day | ORAL | Status: DC

## 2013-05-25 MED ORDER — NYSTATIN 100000 UNIT/GM EX OINT: 1.0000 "application " | TOPICAL_OINTMENT | Freq: Two times a day (BID) | CUTANEOUS | Status: DC

## 2013-05-25 MED ORDER — PROMETHAZINE HCL 25 MG PO TABS: 25.0000 mg | ORAL_TABLET | Freq: Three times a day (TID) | ORAL | Status: DC | PRN

## 2013-05-25 NOTE — Progress Notes (Signed)
P= 89 C/o of severe acid reflux and states the protonix is not helping. N/V. Needs refill of phenergan.  Edema right leg. C/o of intermittent lower abdominal/pelvic pressure.

## 2013-05-25 NOTE — Addendum Note (Signed)
Addended by: Mora Bellman on: 05/25/2013 11:25 AM   Modules accepted: Orders

## 2013-05-25 NOTE — Progress Notes (Signed)
Patient doing well other than persistent heartburn (which was a problem for her prior to pregnancy). Rx phenergan provided. Patient without any other complaints. Bilateral lower extremity same size, no edema. FM/PTL precautions reviewed. Patient plans to breastfeed and is not interested in any forms of contraception at this time. Patient desires cultures at her next visit as she is in a hurry

## 2013-05-29 ENCOUNTER — Other Ambulatory Visit: Payer: Self-pay | Admitting: Physician Assistant

## 2013-05-30 LAB — BENZODIAZEPINES (GC/LC/MS), URINE
Alprazolam (GC/LC/MS), ur confirm: NEGATIVE ng/mL
Alprazolam metabolite (GC/LC/MS), ur confirm: NEGATIVE ng/mL
Clonazepam metabolite (GC/LC/MS), ur confirm: 123 ng/mL — AB
Diazepam (GC/LC/MS), ur confirm: NEGATIVE ng/mL
Estazolam (GC/LC/MS), ur confirm: NEGATIVE ng/mL
Flunitrazepam metabolite (GC/LC/MS), ur confirm: NEGATIVE ng/mL
Flurazepam metabolite (GC/LC/MS), ur confirm: NEGATIVE ng/mL
Lorazepam (GC/LC/MS), ur confirm: NEGATIVE ng/mL
Midazolam (GC/LC/MS), ur confirm: NEGATIVE ng/mL
Nordiazepam (GC/LC/MS), ur confirm: NEGATIVE ng/mL
Oxazepam (GC/LC/MS), ur confirm: NEGATIVE ng/mL
Temazepam (GC/LC/MS), ur confirm: 137 ng/mL — AB
Triazolam metabolite (GC/LC/MS), ur confirm: NEGATIVE ng/mL

## 2013-05-30 LAB — BUPRENORPHINES (GC/LC/MS), URINE
Buprenorphine: 66 ng/mL — AB
Norbuprenorphine: 156 ng/mL — AB

## 2013-05-30 LAB — PRESCRIPTION MONITORING PROFILE (19 PANEL)
Amphetamine/Meth: NEGATIVE ng/mL
Barbiturate Screen, Urine: NEGATIVE ng/mL
Cannabinoid Scrn, Ur: NEGATIVE ng/mL
Carisoprodol, Urine: NEGATIVE ng/mL
Cocaine Metabolites: NEGATIVE ng/mL
Creatinine, Urine: 20.93 mg/dL (ref >20.0)
Fentanyl, Ur: NEGATIVE ng/mL
MDMA URINE: NEGATIVE ng/mL
Meperidine, Ur: NEGATIVE ng/mL
Methadone Screen, Urine: NEGATIVE ng/mL
Methaqualone: NEGATIVE ng/mL
Nitrites, Initial: NEGATIVE ug/mL
Opiate Screen, Urine: NEGATIVE ng/mL
Oxycodone Screen, Ur: NEGATIVE ng/mL
Phencyclidine, Ur: NEGATIVE ng/mL
Propoxyphene: NEGATIVE ng/mL
Tapentadol, urine: NEGATIVE ng/mL
Tramadol Scrn, Ur: NEGATIVE ng/mL
Zolpidem, Urine: NEGATIVE ng/mL
pH, Initial: 6.7 pH (ref 4.5–8.9)

## 2013-06-01 ENCOUNTER — Ambulatory Visit (INDEPENDENT_AMBULATORY_CARE_PROVIDER_SITE_OTHER): Payer: Medicaid Other | Admitting: Family Medicine

## 2013-06-01 VITALS — BP 134/93 | Temp 97.6°F | Wt 243.6 lb

## 2013-06-01 DIAGNOSIS — O99320 Drug use complicating pregnancy, unspecified trimester: Secondary | ICD-10-CM

## 2013-06-01 DIAGNOSIS — F411 Generalized anxiety disorder: Secondary | ICD-10-CM

## 2013-06-01 DIAGNOSIS — F1910 Other psychoactive substance abuse, uncomplicated: Secondary | ICD-10-CM

## 2013-06-01 DIAGNOSIS — Z3490 Encounter for supervision of normal pregnancy, unspecified, unspecified trimester: Secondary | ICD-10-CM

## 2013-06-01 DIAGNOSIS — Z331 Pregnant state, incidental: Secondary | ICD-10-CM

## 2013-06-01 DIAGNOSIS — O99340 Other mental disorders complicating pregnancy, unspecified trimester: Secondary | ICD-10-CM

## 2013-06-01 LAB — POCT URINALYSIS DIP (DEVICE)
Bilirubin Urine: NEGATIVE
Glucose, UA: NEGATIVE mg/dL
Hgb urine dipstick: NEGATIVE
Ketones, ur: NEGATIVE mg/dL
Leukocytes, UA: NEGATIVE
Nitrite: NEGATIVE
Protein, ur: NEGATIVE mg/dL
Specific Gravity, Urine: 1.01 (ref 1.005–1.030)
Urobilinogen, UA: 0.2 mg/dL (ref 0.0–1.0)
pH: 7 (ref 5.0–8.0)

## 2013-06-01 LAB — OB RESULTS CONSOLE GC/CHLAMYDIA
Chlamydia: NEGATIVE
Gonorrhea: NEGATIVE

## 2013-06-01 LAB — OB RESULTS CONSOLE GBS: GBS: NEGATIVE

## 2013-06-01 NOTE — Progress Notes (Signed)
Pulse- 108  Pressure- lower abd, vaginal   Edema-right leg

## 2013-06-01 NOTE — Progress Notes (Signed)
S: 38 yo G3P1011 @ [redacted]w[redacted]d here for ROBV - starting to be more uncomfortable - some rib pain - states needs to get more klonopin because she is getting it from other places. Had a drug screen that was negative on 12/8 and was told she couldn't get any more from [place]. Then she - No ctx, LOF, VB. +FM   O: see flowsheet   A/P - hx of L4-L5 discectomy and discussed needs to get op report in order to have an epidural  - rx refilled of nystatin powder - cultures done today - pregnancy doing well overall  ** klonopin-    Reviewed notes from dr. Elly Modena and UDS results. Negative for benzos on 12/18 and positive on 1/8    Now pt taking aunt's klonopin     [place] narcotic database reviewed and no evidence of klonopin prescriptions in the system. However, this did not make sense given her hx and the pharmacy was called where she fills her rx's and she was in fact filling it with last rx filled on 12/8 #42.    Again, unfortunately discussed with pt that due to all the discrepancies in her drug tests and records, we are unable to refill her clonopin particularly during pregnancy   She is welcome to discuss this with her outpatient rehab program     Pt was very frustrated, tearful, and left angry

## 2013-06-02 LAB — GC/CHLAMYDIA PROBE AMP
CT Probe RNA: NEGATIVE
GC Probe RNA: NEGATIVE

## 2013-06-03 LAB — CULTURE, BETA STREP (GROUP B ONLY)

## 2013-06-05 ENCOUNTER — Encounter: Payer: Self-pay | Admitting: Family Medicine

## 2013-06-08 ENCOUNTER — Other Ambulatory Visit: Payer: Self-pay | Admitting: Obstetrics and Gynecology

## 2013-06-08 ENCOUNTER — Encounter: Payer: Self-pay | Admitting: Family Medicine

## 2013-06-08 ENCOUNTER — Ambulatory Visit (INDEPENDENT_AMBULATORY_CARE_PROVIDER_SITE_OTHER): Payer: Medicaid Other | Admitting: Family Medicine

## 2013-06-08 ENCOUNTER — Telehealth: Payer: Self-pay | Admitting: General Practice

## 2013-06-08 VITALS — BP 136/85 | Temp 98.3°F | Wt 243.9 lb

## 2013-06-08 DIAGNOSIS — B354 Tinea corporis: Secondary | ICD-10-CM | POA: Insufficient documentation

## 2013-06-08 DIAGNOSIS — Z23 Encounter for immunization: Secondary | ICD-10-CM

## 2013-06-08 DIAGNOSIS — O99320 Drug use complicating pregnancy, unspecified trimester: Secondary | ICD-10-CM

## 2013-06-08 DIAGNOSIS — O0990 Supervision of high risk pregnancy, unspecified, unspecified trimester: Secondary | ICD-10-CM

## 2013-06-08 DIAGNOSIS — F1920 Other psychoactive substance dependence, uncomplicated: Secondary | ICD-10-CM

## 2013-06-08 DIAGNOSIS — O09529 Supervision of elderly multigravida, unspecified trimester: Secondary | ICD-10-CM

## 2013-06-08 MED ORDER — TETANUS-DIPHTH-ACELL PERTUSSIS 5-2.5-18.5 LF-MCG/0.5 IM SUSP
0.5000 mL | Freq: Once | INTRAMUSCULAR | Status: DC
Start: 2013-06-08 — End: 2013-06-08

## 2013-06-08 MED ORDER — PROMETHAZINE HCL 25 MG PO TABS: 25.0000 mg | ORAL_TABLET | Freq: Three times a day (TID) | ORAL | Status: DC | PRN

## 2013-06-08 MED ORDER — NYSTATIN 100000 UNIT/GM EX POWD: 1.0000 g | Freq: Three times a day (TID) | CUTANEOUS | Status: DC

## 2013-06-08 NOTE — Telephone Encounter (Signed)
Patient called and left message stating she saw Dr Kennon Rounds this morning and she forgot to ask her about a refill on her Phenergan and would like a refill called in to her [facility] pharmacy. Called patient and stated that Dr Kennon Rounds wasn't in the clinic this afternoon and is in surgery, but I will send her a message letting her know that you would like a refill on the phenergan. Patient verbalized understanding and had no further questions

## 2013-06-08 NOTE — Progress Notes (Signed)
P=120. Patient very anxious-blood pressure repeated after relaxed.

## 2013-06-08 NOTE — Progress Notes (Signed)
Desires epidural--will need x-rays and op report. Lengthy conversation > 1 hour had with pt. and with her partner about our strict policy of negative UDS and no longer filling medication. Pt. thinks her UDS was inaccurate.  Has been on Klonopin for years and cannot be weaned off.  Worried about DSS referral based on Klonopin .  Advised that Subutex/Suboxone is another reason for DSS referral.  She is also upset about seeing different MD's, wanted to see Dr. Olevia Bowens again. (Although, apparently requested to see D. Poe, CNM after last visit and was not pleased with Dr. Olevia Bowens per other clinic staff.) Pt. has been getting monthly rx for Klonopin, in the system.  Additionally, she brings a printout from her pharmacy proving she has picked up rx monthly. (she stated she brought it for Dr. Olevia Bowens to prove to her she was getting it) her last  I have no doubt that she has been receiving it.  She has talked to 2 providers and the SW, who have advised psychiatry and other ways to safely obtain the medication, previously--this was reiterated, however, the pt. was tearful and angry.  Anger was expressed by her partner also.   I tried to rationally explain, concerns about diversion, drug seeking behavior and clinic policy, concern for our licensure and overprescribing.  The patient and her partner accused this clinic of not caring about her or the safety of her baby. Lawsuits were threatened.  The patient then explained to the front office staff that she would not be returning to our office and may be going elsewhere for delivery.

## 2013-06-08 NOTE — Progress Notes (Signed)
ROI signed and sent to [facility].

## 2013-06-08 NOTE — Patient Instructions (Signed)
Breastfeeding Deciding to breastfeed is one of the best choices you can make for you and your baby. A change in hormones during pregnancy causes your breast tissue to grow and increases the number and size of your milk ducts. These hormones also allow proteins, sugars, and fats from your blood supply to make breast milk in your milk-producing glands. Hormones prevent breast milk from being released before your baby is born as well as prompt milk flow after birth. Once breastfeeding has begun, thoughts of your baby, as well as his or her sucking or crying, can stimulate the release of milk from your milk-producing glands.  BENEFITS OF BREASTFEEDING For Your Baby  Your first milk (colostrum) helps your baby's digestive system function better.   There are antibodies in your milk that help your baby fight off infections.   Your baby has a lower incidence of asthma, allergies, and sudden infant death syndrome.   The nutrients in breast milk are better for your baby than infant formulas and are designed uniquely for your baby's needs.   Breast milk improves your baby's brain development.   Your baby is less likely to develop other conditions, such as childhood obesity, asthma, or type 2 diabetes mellitus.  For You   Breastfeeding helps to create a very special bond between you and your baby.   Breastfeeding is convenient. Breast milk is always available at the correct temperature and costs nothing.   Breastfeeding helps to burn calories and helps you lose the weight gained during pregnancy.   Breastfeeding makes your uterus contract to its prepregnancy size faster and slows bleeding (lochia) after you give birth.   Breastfeeding helps to lower your risk of developing type 2 diabetes mellitus, osteoporosis, and breast or ovarian cancer later in life. SIGNS THAT YOUR BABY IS HUNGRY Early Signs of Hunger  Increased alertness or activity.  Stretching.  Movement of the head from  side to side.  Movement of the head and opening of the mouth when the corner of the mouth or cheek is stroked (rooting).  Increased sucking sounds, smacking lips, cooing, sighing, or squeaking.  Hand-to-mouth movements.  Increased sucking of fingers or hands. Late Signs of Hunger  Fussing.  Intermittent crying. Extreme Signs of Hunger Signs of extreme hunger will require calming and consoling before your baby will be able to breastfeed successfully. Do not wait for the following signs of extreme hunger to occur before you initiate breastfeeding:   Restlessness.  A loud, strong cry.   Screaming. BREASTFEEDING BASICS Breastfeeding Initiation  Find a comfortable place to sit or lie down, with your neck and back well supported.  Place a pillow or rolled up blanket under your baby to bring him or her to the level of your breast (if you are seated). Nursing pillows are specially designed to help support your arms and your baby while you breastfeed.  Make sure that your baby's abdomen is facing your abdomen.   Gently massage your breast. With your fingertips, massage from your chest wall toward your nipple in a circular motion. This encourages milk flow. You may need to continue this action during the feeding if your milk flows slowly.  Support your breast with 4 fingers underneath and your thumb above your nipple. Make sure your fingers are well away from your nipple and your baby's mouth.   Stroke your baby's lips gently with your finger or nipple.   When your baby's mouth is open wide enough, quickly bring your baby to your   breast, placing your entire nipple and as much of the colored area around your nipple (areola) as possible into your baby's mouth.   More areola should be visible above your baby's upper lip than below the lower lip.   Your baby's tongue should be between his or her lower gum and your breast.   Ensure that your baby's mouth is correctly positioned  around your nipple (latched). Your baby's lips should create a seal on your breast and be turned out (everted).  It is common for your baby to suck about 2 3 minutes in order to start the flow of breast milk. Latching Teaching your baby how to latch on to your breast properly is very important. An improper latch can cause nipple pain and decreased milk supply for you and poor weight gain in your baby. Also, if your baby is not latched onto your nipple properly, he or she may swallow some air during feeding. This can make your baby fussy. Burping your baby when you switch breasts during the feeding can help to get rid of the air. However, teaching your baby to latch on properly is still the best way to prevent fussiness from swallowing air while breastfeeding. Signs that your baby has successfully latched on to your nipple:    Silent tugging or silent sucking, without causing you pain.   Swallowing heard between every 3 4 sucks.    Muscle movement above and in front of his or her ears while sucking.  Signs that your baby has not successfully latched on to nipple:   Sucking sounds or smacking sounds from your baby while breastfeeding.  Nipple pain. If you think your baby has not latched on correctly, slip your finger into the corner of your baby's mouth to break the suction and place it between your baby's gums. Attempt breastfeeding initiation again. Signs of Successful Breastfeeding Signs from your baby:   A gradual decrease in the number of sucks or complete cessation of sucking.   Falling asleep.   Relaxation of his or her body.   Retention of a small amount of milk in his or her mouth.   Letting go of your breast by himself or herself. Signs from you:  Breasts that have increased in firmness, weight, and size 1 3 hours after feeding.   Breasts that are softer immediately after breastfeeding.  Increased milk volume, as well as a change in milk consistency and color by  the 5th day of breastfeeding.   Nipples that are not sore, cracked, or bleeding. Signs That Your Randel Books is Getting Enough Milk  Wetting at least 3 diapers in a 24-hour period. The urine should be clear and pale yellow by age 64411 days.  At least 3 stools in a 24-hour period by age 64411 days. The stool should be soft and yellow.  At least 3 stools in a 24-hour period by age 644 days. The stool should be seedy and yellow.  No loss of weight greater than 10% of birth weight during the first 22 days of age.  Average weight gain of 4 7 ounces (120 210 mL) per week after age 64 days.  Consistent daily weight gain by age 60 days, without weight loss after the age of 2 weeks. After a feeding, your baby may spit up a small amount. This is common. BREASTFEEDING FREQUENCY AND DURATION Frequent feeding will help you make more milk and can prevent sore nipples and breast engorgement. Breastfeed when you feel the need to reduce  the fullness of your breasts or when your baby shows signs of hunger. This is called "breastfeeding on demand." Avoid introducing a pacifier to your baby while you are working to establish breastfeeding (the first 4 6 weeks after your baby is born). After this time you may choose to use a pacifier. Research has shown that pacifier use during the first year of a baby's life decreases the risk of sudden infant death syndrome (SIDS). Allow your baby to feed on each breast as long as he or she wants. Breastfeed until your baby is finished feeding. When your baby unlatches or falls asleep while feeding from the first breast, offer the second breast. Because newborns are often sleepy in the first few weeks of life, you may need to awaken your baby to get him or her to feed. Breastfeeding times will vary from baby to baby. However, the following rules can serve as a guide to help you ensure that your baby is properly fed:  Newborns (babies 4 weeks of age or younger) may breastfeed every 1 3  hours.  Newborns should not go longer than 3 hours during the day or 5 hours during the night without breastfeeding.  You should breastfeed your baby a minimum of 8 times in a 24-hour period until you begin to introduce solid foods to your baby at around 6 months of age. BREAST MILK PUMPING Pumping and storing breast milk allows you to ensure that your baby is exclusively fed your breast milk, even at times when you are unable to breastfeed. This is especially important if you are going back to work while you are still breastfeeding or when you are not able to be present during feedings. Your lactation consultant can give you guidelines on how long it is safe to store breast milk.  A breast pump is a machine that allows you to pump milk from your breast into a sterile bottle. The pumped breast milk can then be stored in a refrigerator or freezer. Some breast pumps are operated by hand, while others use electricity. Ask your lactation consultant which type will work best for you. Breast pumps can be purchased, but some hospitals and breastfeeding support groups lease breast pumps on a monthly basis. A lactation consultant can teach you how to hand express breast milk, if you prefer not to use a pump.  CARING FOR YOUR BREASTS WHILE YOU BREASTFEED Nipples can become dry, cracked, and sore while breastfeeding. The following recommendations can help keep your breasts moisturized and healthy:  Avoid using soap on your nipples.   Wear a supportive bra. Although not required, special nursing bras and tank tops are designed to allow access to your breasts for breastfeeding without taking off your entire bra or top. Avoid wearing underwire style bras or extremely tight bras.  Air dry your nipples for 3 4minutes after each feeding.   Use only cotton bra pads to absorb leaked breast milk. Leaking of breast milk between feedings is normal.   Use lanolin on your nipples after breastfeeding. Lanolin helps to  maintain your skin's normal moisture barrier. If you use pure lanolin you do not need to wash it off before feeding your baby again. Pure lanolin is not toxic to your baby. You may also hand express a few drops of breast milk and gently massage that milk into your nipples and allow the milk to air dry. In the first few weeks after giving birth, some women experience extremely full breasts (engorgement). Engorgement can make   your breasts feel heavy, warm, and tender to the touch. Engorgement peaks within 3 5 days after you give birth. The following recommendations can help ease engorgement:  Completely empty your breasts while breastfeeding or pumping. You may want to start by applying warm, moist heat (in the shower or with warm water-soaked hand towels) just before feeding or pumping. This increases circulation and helps the milk flow. If your baby does not completely empty your breasts while breastfeeding, pump any extra milk after he or she is finished.  Wear a snug bra (nursing or regular) or tank top for 1 2 days to signal your body to slightly decrease milk production.  Apply ice packs to your breasts, unless this is too uncomfortable for you.  Make sure that your baby is latched on and positioned properly while breastfeeding. If engorgement persists after 48 hours of following these recommendations, contact your health care provider or a Science writer. OVERALL HEALTH CARE RECOMMENDATIONS WHILE BREASTFEEDING  Eat healthy foods. Alternate between meals and snacks, eating 3 of each per day. Because what you eat affects your breast milk, some of the foods may make your baby more irritable than usual. Avoid eating these foods if you are sure that they are negatively affecting your baby.  Drink milk, fruit juice, and water to satisfy your thirst (about 10 glasses a day).   Rest often, relax, and continue to take your prenatal vitamins to prevent fatigue, stress, and anemia.  Continue  breast self-awareness checks.  Avoid chewing and smoking tobacco.  Avoid alcohol and drug use. Some medicines that may be harmful to your baby can pass through breast milk. It is important to ask your health care provider before taking any medicine, including all over-the-counter and prescription medicine as well as vitamin and herbal supplements. It is possible to become pregnant while breastfeeding. If birth control is desired, ask your health care provider about options that will be safe for your baby. SEEK MEDICAL CARE IF:   You feel like you want to stop breastfeeding or have become frustrated with breastfeeding.  You have painful breasts or nipples.  Your nipples are cracked or bleeding.  Your breasts are red, tender, or warm.  You have a swollen area on either breast.  You have a fever or chills.  You have nausea or vomiting.  You have drainage other than breast milk from your nipples.  Your breasts do not become full before feedings by the 5th day after you give birth.  You feel sad and depressed.  Your baby is too sleepy to eat well.  Your baby is having trouble sleeping.   Your baby is wetting less than 3 diapers in a 24-hour period.  Your baby has less than 3 stools in a 24-hour period.  Your baby's skin or the white part of his or her eyes becomes yellow.   Your baby is not gaining weight by 74 days of age. SEEK IMMEDIATE MEDICAL CARE IF:   Your baby is overly tired (lethargic) and does not want to wake up and feed.  Your baby develops an unexplained fever. Document Released: 05/04/2005 Document Revised: 01/04/2013 Document Reviewed: 10/26/2012 [facility] Patient Information 2014 [place].

## 2013-06-12 NOTE — Telephone Encounter (Signed)
Called patient and notified her of refill on phenergan and asked if she was still needing the nexium (note received from pharmacy that medicaid will not pay for nexium) and the patient stated yes but she also had a Rx previously for protonix and didn't know if she had refills on it or not. Told patient she does have 2 refills remaining on the protonix so she just has to call her pharmacy and they will fill the medication for her. Patient verbalized understanding to all and had no further questions

## 2013-06-15 ENCOUNTER — Encounter: Payer: Self-pay | Admitting: *Deleted

## 2013-06-20 ENCOUNTER — Inpatient Hospital Stay (HOSPITAL_COMMUNITY)
Admission: AD | Admit: 2013-06-20 | Discharge: 2013-06-20 | Disposition: A | Discharge status: Home / Self Care | Payer: Medicaid Other | Source: Ambulatory Visit | Attending: Obstetrics & Gynecology | Admitting: Obstetrics & Gynecology

## 2013-06-20 ENCOUNTER — Encounter (HOSPITAL_COMMUNITY): Payer: Self-pay | Admitting: General Practice

## 2013-06-20 DIAGNOSIS — O479 False labor, unspecified: Secondary | ICD-10-CM | POA: Insufficient documentation

## 2013-06-20 NOTE — MAU Note (Signed)
Patient states she is having contractions every 15 minutes but feels like they are "going to knock me out". Denies bleeding or leaking and reports good fetal movement. Has urinary frequency and lower abdominal pressure.

## 2013-06-20 NOTE — MAU Note (Signed)
Pt presents to MAU with c/o "feeling like she's trying to crawl out" & stomach tightness.

## 2013-06-20 NOTE — Discharge Instructions (Signed)

## 2013-06-22 ENCOUNTER — Encounter: Payer: Medicaid Other | Admitting: Obstetrics & Gynecology

## 2013-06-23 ENCOUNTER — Ambulatory Visit (INDEPENDENT_AMBULATORY_CARE_PROVIDER_SITE_OTHER): Payer: Medicaid Other | Admitting: Family

## 2013-06-23 VITALS — BP 133/89 | Wt 250.9 lb

## 2013-06-23 DIAGNOSIS — F1920 Other psychoactive substance dependence, uncomplicated: Secondary | ICD-10-CM

## 2013-06-23 DIAGNOSIS — O99320 Drug use complicating pregnancy, unspecified trimester: Secondary | ICD-10-CM

## 2013-06-23 DIAGNOSIS — O09529 Supervision of elderly multigravida, unspecified trimester: Secondary | ICD-10-CM

## 2013-06-23 DIAGNOSIS — Z3480 Encounter for supervision of other normal pregnancy, unspecified trimester: Secondary | ICD-10-CM

## 2013-06-23 LAB — POCT URINALYSIS DIP (DEVICE)
Bilirubin Urine: NEGATIVE
Glucose, UA: NEGATIVE mg/dL
Hgb urine dipstick: NEGATIVE
Ketones, ur: NEGATIVE mg/dL
Leukocytes, UA: NEGATIVE
Nitrite: NEGATIVE
Protein, ur: NEGATIVE mg/dL
Specific Gravity, Urine: 1.01 (ref 1.005–1.030)
Urobilinogen, UA: 0.2 mg/dL (ref 0.0–1.0)
pH: 6.5 (ref 5.0–8.0)

## 2013-06-23 NOTE — Progress Notes (Signed)
Pulse:86  Patient reports severe back pain. It started last night.

## 2013-06-23 NOTE — Progress Notes (Signed)
Reports back pain; "feels like pinched nerve".  No change in bowel or bladder.  Consulted with Dr. Harolyn Rutherford > NST today, NST and AFI today.  Pt states cannot stay for NST, will return on Monday.

## 2013-06-25 ENCOUNTER — Encounter (HOSPITAL_COMMUNITY): Payer: Self-pay | Admitting: *Deleted

## 2013-06-25 ENCOUNTER — Inpatient Hospital Stay (HOSPITAL_COMMUNITY)
Admission: AD | Admit: 2013-06-25 | Discharge: 2013-06-25 | Disposition: A | Discharge status: Home / Self Care | Payer: Medicaid Other | Source: Ambulatory Visit | Attending: Obstetrics & Gynecology | Admitting: Obstetrics & Gynecology

## 2013-06-25 DIAGNOSIS — M549 Dorsalgia, unspecified: Secondary | ICD-10-CM | POA: Insufficient documentation

## 2013-06-25 DIAGNOSIS — O09529 Supervision of elderly multigravida, unspecified trimester: Secondary | ICD-10-CM

## 2013-06-25 DIAGNOSIS — O99320 Drug use complicating pregnancy, unspecified trimester: Secondary | ICD-10-CM

## 2013-06-25 DIAGNOSIS — O0990 Supervision of high risk pregnancy, unspecified, unspecified trimester: Secondary | ICD-10-CM

## 2013-06-25 DIAGNOSIS — O479 False labor, unspecified: Secondary | ICD-10-CM | POA: Insufficient documentation

## 2013-06-25 HISTORY — DX: Unspecified abnormal cytological findings in specimens from vagina: R87.629

## 2013-06-25 NOTE — Discharge Instructions (Signed)

## 2013-06-25 NOTE — MAU Note (Signed)
Constant back pain last couple days, contractions come and go- getting closer, started this morning. No bleeding, ? Leaking x1 last night.

## 2013-06-27 ENCOUNTER — Ambulatory Visit (INDEPENDENT_AMBULATORY_CARE_PROVIDER_SITE_OTHER): Payer: Medicaid Other | Admitting: *Deleted

## 2013-06-27 VITALS — BP 118/86

## 2013-06-27 DIAGNOSIS — O480 Post-term pregnancy: Secondary | ICD-10-CM

## 2013-06-27 LAB — US OB FOLLOW UP

## 2013-06-27 NOTE — Progress Notes (Signed)
P-[id number] 

## 2013-06-28 ENCOUNTER — Encounter (HOSPITAL_COMMUNITY): Payer: Self-pay | Admitting: *Deleted

## 2013-06-28 ENCOUNTER — Telehealth (HOSPITAL_COMMUNITY): Payer: Self-pay | Admitting: *Deleted

## 2013-06-28 NOTE — Telephone Encounter (Signed)
Preadmission screen  

## 2013-06-30 ENCOUNTER — Inpatient Hospital Stay (HOSPITAL_COMMUNITY)
Admission: RE | Admit: 2013-06-30 | Discharge: 2013-07-03 | DRG: 775 | Disposition: A | Discharge status: Home / Self Care | Payer: Medicaid Other | Source: Ambulatory Visit | Attending: Obstetrics and Gynecology | Admitting: Obstetrics and Gynecology

## 2013-06-30 ENCOUNTER — Encounter (HOSPITAL_COMMUNITY): Payer: Self-pay

## 2013-06-30 VITALS — BP 94/59 | HR 74 | Temp 97.6°F | Resp 18 | Ht 69.0 in | Wt 257.0 lb

## 2013-06-30 DIAGNOSIS — Z3490 Encounter for supervision of normal pregnancy, unspecified, unspecified trimester: Secondary | ICD-10-CM

## 2013-06-30 DIAGNOSIS — O09529 Supervision of elderly multigravida, unspecified trimester: Secondary | ICD-10-CM

## 2013-06-30 DIAGNOSIS — O99320 Drug use complicating pregnancy, unspecified trimester: Secondary | ICD-10-CM

## 2013-06-30 DIAGNOSIS — O480 Post-term pregnancy: Principal | ICD-10-CM | POA: Diagnosis present

## 2013-06-30 DIAGNOSIS — O360990 Maternal care for other rhesus isoimmunization, unspecified trimester, not applicable or unspecified: Secondary | ICD-10-CM | POA: Diagnosis present

## 2013-06-30 DIAGNOSIS — O0990 Supervision of high risk pregnancy, unspecified, unspecified trimester: Secondary | ICD-10-CM

## 2013-06-30 LAB — CBC
HCT: 33.5 % — ABNORMAL LOW (ref 36.0–46.0)
Hemoglobin: 11.8 g/dL — ABNORMAL LOW (ref 12.0–15.0)
MCH: 30.1 pg (ref 26.0–34.0)
MCHC: 35.2 g/dL (ref 30.0–36.0)
MCV: 85.5 fL (ref 78.0–100.0)
Platelets: 199 10*3/uL (ref 150–400)
RBC: 3.92 MIL/uL (ref 3.87–5.11)
RDW: 13.5 % (ref 11.5–15.5)
WBC: 8.6 10*3/uL (ref 4.0–10.5)

## 2013-06-30 LAB — RAPID URINE DRUG SCREEN, HOSP PERFORMED
Amphetamines: NOT DETECTED
Barbiturates: NOT DETECTED
Benzodiazepines: POSITIVE — AB
Cocaine: NOT DETECTED
Opiates: NOT DETECTED
Tetrahydrocannabinol: NOT DETECTED

## 2013-06-30 LAB — TYPE AND SCREEN
ABO/RH(D): O NEG
Antibody Screen: NEGATIVE

## 2013-06-30 LAB — ABO/RH: ABO/RH(D): O NEG

## 2013-06-30 LAB — RPR: RPR Ser Ql: NONREACTIVE

## 2013-06-30 MED ORDER — LACTATED RINGERS IV SOLN
500.0000 mL | INTRAVENOUS | Status: DC | PRN
  Administered 2013-07-02 (×3): 500 mL via INTRAVENOUS
  Administered 2013-07-02: 200 mL via INTRAVENOUS

## 2013-06-30 MED ORDER — FLEET ENEMA 7-19 GM/118ML RE ENEM: 1.0000 | ENEMA | RECTAL | Status: DC | PRN

## 2013-06-30 MED ORDER — ZOLPIDEM TARTRATE 5 MG PO TABS
5.0000 mg | ORAL_TABLET | Freq: Every evening | ORAL | Status: DC | PRN
  Administered 2013-06-30: 5 mg via ORAL
  Filled 2013-06-30: qty 1

## 2013-06-30 MED ORDER — OXYCODONE-ACETAMINOPHEN 5-325 MG PO TABS: 1.0000 | ORAL_TABLET | ORAL | Status: DC | PRN

## 2013-06-30 MED ORDER — BUPRENORPHINE HCL 2 MG SL SUBL
4.0000 mg | SUBLINGUAL_TABLET | Freq: Every day | SUBLINGUAL | Status: DC
  Administered 2013-07-01 – 2013-07-03 (×3): 4 mg via SUBLINGUAL
  Filled 2013-06-30: qty 2
  Filled 2013-06-30 (×2): qty 0.5

## 2013-06-30 MED ORDER — LIDOCAINE HCL (PF) 1 % IJ SOLN
30.0000 mL | INTRAMUSCULAR | Status: DC | PRN
  Filled 2013-06-30: qty 30

## 2013-06-30 MED ORDER — LACTATED RINGERS IV SOLN
INTRAVENOUS | Status: DC
  Administered 2013-06-30 – 2013-07-02 (×5): via INTRAVENOUS

## 2013-06-30 MED ORDER — BUPRENORPHINE HCL 8 MG SL SUBL
8.0000 mg | SUBLINGUAL_TABLET | Freq: Once | SUBLINGUAL | Status: AC
  Administered 2013-06-30: 8 mg via SUBLINGUAL
  Filled 2013-06-30: qty 1

## 2013-06-30 MED ORDER — CLONAZEPAM 1 MG PO TABS: 1.0000 mg | ORAL_TABLET | Freq: Two times a day (BID) | ORAL | Status: DC | PRN

## 2013-06-30 MED ORDER — ONDANSETRON HCL 4 MG/2ML IJ SOLN: 4.0000 mg | Freq: Four times a day (QID) | INTRAMUSCULAR | Status: DC | PRN

## 2013-06-30 MED ORDER — MISOPROSTOL 25 MCG QUARTER TABLET
25.0000 ug | ORAL_TABLET | ORAL | Status: DC | PRN
  Administered 2013-06-30: 25 ug via VAGINAL
  Filled 2013-06-30 (×6): qty 0.25
  Filled 2013-06-30: qty 1
  Filled 2013-06-30: qty 0.25

## 2013-06-30 MED ORDER — ACETAMINOPHEN 325 MG PO TABS
650.0000 mg | ORAL_TABLET | ORAL | Status: DC | PRN
  Administered 2013-06-30: 650 mg via ORAL
  Filled 2013-06-30: qty 2

## 2013-06-30 MED ORDER — OXYTOCIN BOLUS FROM INFUSION: 500.0000 mL | INTRAVENOUS | Status: DC

## 2013-06-30 MED ORDER — MISOPROSTOL 25 MCG QUARTER TABLET
50.0000 ug | ORAL_TABLET | Freq: Once | ORAL | Status: AC
  Administered 2013-06-30: 50 ug via ORAL

## 2013-06-30 MED ORDER — CLONAZEPAM 0.5 MG PO TABS
1.0000 mg | ORAL_TABLET | Freq: Two times a day (BID) | ORAL | Status: DC | PRN
  Administered 2013-06-30 – 2013-07-02 (×4): 1 mg via ORAL
  Filled 2013-06-30 (×4): qty 2

## 2013-06-30 MED ORDER — BUPRENORPHINE HCL 2 MG SL SUBL
4.0000 mg | SUBLINGUAL_TABLET | SUBLINGUAL | Status: DC
  Administered 2013-07-01 – 2013-07-03 (×2): 4 mg via SUBLINGUAL
  Filled 2013-06-30 (×3): qty 2
  Filled 2013-06-30: qty 0.5

## 2013-06-30 MED ORDER — IBUPROFEN 600 MG PO TABS
600.0000 mg | ORAL_TABLET | Freq: Four times a day (QID) | ORAL | Status: DC | PRN
Start: 2013-06-30 — End: 2013-07-02

## 2013-06-30 MED ORDER — OXYTOCIN 40 UNITS IN LACTATED RINGERS INFUSION - SIMPLE MED
62.5000 mL/h | INTRAVENOUS | Status: DC
  Administered 2013-07-02: 62.5 mL/h via INTRAVENOUS
  Filled 2013-06-30: qty 1000

## 2013-06-30 MED ORDER — BUPRENORPHINE HCL 8 MG SL SUBL: 4.0000 mg | SUBLINGUAL_TABLET | Freq: Every day | SUBLINGUAL | Status: DC

## 2013-06-30 MED ORDER — MISOPROSTOL 25 MCG QUARTER TABLET
50.0000 ug | ORAL_TABLET | Freq: Once | ORAL | Status: AC
  Administered 2013-06-30: 50 ug via ORAL
  Filled 2013-06-30: qty 0.5

## 2013-06-30 MED ORDER — TERBUTALINE SULFATE 1 MG/ML IJ SOLN: 0.2500 mg | Freq: Once | INTRAMUSCULAR | Status: AC | PRN

## 2013-06-30 MED ORDER — CITRIC ACID-SODIUM CITRATE 334-500 MG/5ML PO SOLN
30.0000 mL | ORAL | Status: DC | PRN
  Filled 2013-06-30: qty 15

## 2013-06-30 NOTE — Progress Notes (Signed)
Daisy Tucker is a 38 y.o. G3P1011 at [redacted]w[redacted]d admitted for induction of labor due to postdates.  Subjective:  Doing well. +FM  Objective: BP 117/83  Pulse 99  Temp(Src) 97.7 F (36.5 C) (Oral)  Resp 20  Ht 5\' 9"  (1.753 m)  Wt 116.574 kg (257 lb)  BMI 37.93 kg/m2  LMP 09/16/2012      FHT:  FHR: 120 bpm, variability: moderate,  accelerations:  Present,  decelerations:  Absent UC:   irregular SVE:   Dilation: 1 Effacement (%): Thick Station: Ballotable Exam by:: Kanae Ignatowski  Labs: Lab Results  Component Value Date   WBC 8.6 06/30/2013   HGB 11.8* 06/30/2013   HCT 33.5* 06/30/2013   MCV 85.5 06/30/2013   PLT 199 06/30/2013    Assessment / Plan: IOL for postdates.  Labor: cont cytotec. attempted foley bulb unsuccessfully. repeat cytotec Fetal Wellbeing:  Category I Pain Control:  Labor support without medications I/D:  n/a Anticipated MOD:  NSVD  Henderson Frampton L 06/30/2013, 4:53 PM

## 2013-06-30 NOTE — Progress Notes (Signed)
Gauri Galvao Gautier is a 38 y.o. G3P1011 at [redacted]w[redacted]d admitted for postdates IOL  Subjective:  Feeling some cramping. No true contractions.  +FM. Has been up and walking the halls.    Objective: BP 132/82  Pulse 94  Temp(Src) 98.1 F (36.7 C) (Oral)  Resp 20  Ht 5\' 9"  (1.753 m)  Wt 116.574 kg (257 lb)  BMI 37.93 kg/m2  LMP 09/16/2012      FHT:  FHR: 150 on doppler bpm UC:   Irritability, contractions every 3-61min but palpated mild.  SVE:   Dilation: 1 Effacement (%): Thick Station: Ballotable Exam by:: Olevia Bowens MD  Labs: Lab Results  Component Value Date   WBC 8.6 06/30/2013   HGB 11.8* 06/30/2013   HCT 33.5* 06/30/2013   MCV 85.5 06/30/2013   PLT 199 06/30/2013    Assessment / Plan: IOL due to postdates needing cervical ripening  Labor: repeat dose of cytotec po given as seemed to have more response to this. cervix slightly softer but still long and thick Fetal Wellbeing:  Category I earlier but now with intermittent monitoring.  Pain Control:  Labor support without medications I/D:  n/a Anticipated MOD:  NSVD  Odalys Win L 06/30/2013, 9:14 PM

## 2013-06-30 NOTE — H&P (Signed)
LABOR ADMISSION HISTORY AND PHYSICAL   Daisy Tucker is a 38 y.o. female G21P1011 with IUP at [redacted]w[redacted]d by LMP c/w 23 week ultrasound presents for scheduled induction of labor for postdates. Pt denies complaints.  No ctx, lof, vb.  +FM.     [facility] since [redacted]w[redacted]d. 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 Pt was initially rxd klonopin from our clinic and then had a negative UDS while on it leading to [place] no longer prescribing her the medication. She is now being seen by a psychiatrist who is prescribing her klonopin for her.  GBS neg.    Prenatal History/Complications:  Past Medical History: Past Medical History  Diagnosis Date  . Anxiety   . Miscarriage     Rh negative  . Nausea   . GERD (gastroesophageal reflux disease)   . PNA (pneumonia)   . Vaginal Pap smear, abnormal     f/u was ok    Past Surgical History: Past Surgical History  Procedure Laterality Date  . Spine surgery  2008    L4-5 disc reconstruction    Obstetrical History: OB History   Grav Para Term Preterm Abortions TAB SAB Ect Mult Living   3 1 1  0 1 1 0 0 0 1    G1- TAB G2- 7lb 50NL, NSVD, no complications  Gynecological History: OB History   Grav Para Term Preterm Abortions TAB SAB Ect Mult Living   3 1 1  0 1 1 0 0 0 1      Social History: History   Social History  . Marital Status: Married    Spouse Name: Vania Rea    Number of Children: 1  . Years of Education: 13   Occupational History  . self-employed     Building control surveyor   Social History Main Topics  . Smoking status: Former Smoker    Types: Cigarettes    Quit date: 03/28/2013  . Smokeless tobacco: Never Used     Comment: patient is on electronic cigarette ; she is trying to quit.  . Alcohol Use: No  . 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 Sexual Activity: Yes    Partners: Male     Birth Control/ Protection: None     Comment: desires pregnancy   Other Topics Concern  . None   Social History Narrative   Married Vania Rea 12/24/2007. His son lives with his mother.   Daughter, Ledell Noss, born 03/22/2004 from a previous relationship, lives with them.    Family History: Family History  Problem Relation Age of Onset  . Stroke Father     x 3  . Alcohol abuse Mother   . Cancer Maternal Grandmother     lung  . Stroke Paternal Grandmother   . Hearing loss Neg Hx     Allergies: Allergies  Allergen Reactions  . Aspirin Other (See Comments)    Stomach intolerance per pt.  . Zofran Hives and Swelling    Swelling local at site of injection.    Prescriptions prior to admission  Medication Sig Dispense Refill  . buprenorphine (SUBUTEX) 8 MG SUBL SL tablet Place 4 mg under the tongue 2 (two) times daily.      . clonazePAM (KLONOPIN) 1 MG tablet Take 1 tablet (1 mg total) by mouth 2 (two) times daily.  42 tablet  0  . nystatin (MYCOSTATIN) powder Apply 1 g topically 2 (two) times daily as needed (for yeast between breast).      . pantoprazole (PROTONIX) 40 MG tablet Take 1 tablet (40 mg total) by mouth daily.  30 tablet  2  . promethazine (PHENERGAN) 25 MG tablet Take 1 tablet (25 mg total) by mouth every 8 (eight) hours as needed for nausea.  42 tablet  2     Review of Systems  All systems reviewed and negative except as stated in HPI    Blood pressure 119/73, pulse 89, temperature 97.7 F (36.5 C), temperature source Oral, resp. rate 20, height 5\' 9"  (1.753 m), weight 116.574 kg (257 lb), last menstrual period 09/16/2012. General appearance: alert, cooperative and no distress Lungs: normal effort Heart: regular rate and rhythm Abdomen: soft, non-tender; bowel sounds normal Extremities: Homans sign is negative, no sign of DVT  Presentation: cephalic Fetal monitoringBaseline: 120 bpm, Variability: Good {> 6 bpm), Accelerations: Reactive and Decelerations:  Absent Uterine activity irritability  Dilation: 1 Effacement (%): 70 Station: -3 Exam by:: Curly [place], RN   Prenatal labs: ABO, Rh: O/NEG/-- (10/30 1135) Antibody: NEG (10/30 1135) Rubella:   RPR: NON REAC (10/30 1135)  HBsAg: NEGATIVE (10/30 1135)  HIV: NON REACTIVE (10/30 1135)  GBS: Negative (01/15 0000)  1 hr Glucola: 107 (Third trimester)  Genetic screening: Panorama requested--drawn 10/30--WNL Anatomy US: WNL    Assessment: Daisy Tucker is a 38 y.o. G3P1011 with an IUP at [redacted]w[redacted]d presenting for postdates induction.    Plan: 1) admit to L&D - routine orders - hold on epidural currently - anesthesia consult for epidural due to hx of lumbar fusion (single level)  2) IOL - bishops unfavorable - start with cytotec and consider FB in the future  3) FWB - cat I tracing - GBS neg - NICU notified given medical hx and current rx use  - EFW 8lb  4) anticipate SVD  Astaria Nanez L, MD

## 2013-06-30 NOTE — Anesthesia Preprocedure Evaluation (Addendum)
Anesthesia Evaluation  Patient identified by MRN, date of birth, ID band Patient awake    Reviewed: Allergy & Precautions, H&P , NPO status , Patient's Chart, lab work & pertinent test results  Airway Mallampati: III TM Distance: >3 FB Neck ROM: full    Dental no notable dental hx.    Pulmonary former smoker,    Pulmonary exam normal       Cardiovascular negative cardio ROS      Neuro/Psych negative neurological ROS     GI/Hepatic Neg liver ROS, GERD-  ,  Endo/Other  negative endocrine ROS  Renal/GU negative Renal ROS     Musculoskeletal   Abdominal Normal abdominal exam  (+)   Peds  Hematology negative hematology ROS (+)   Anesthesia Other Findings Scoliosis with harrington rods  Reproductive/Obstetrics (+) Pregnancy                         Anesthesia Physical Anesthesia Plan  ASA: II  Anesthesia Plan: Epidural   Post-op Pain Management:    Induction:   Airway Management Planned:   Additional Equipment:   Intra-op Plan:   Post-operative Plan:   Informed Consent: I have reviewed the patients History and Physical, chart, labs and discussed the procedure including the risks, benefits and alternatives for the proposed anesthesia with the patient or authorized representative who has indicated his/her understanding and acceptance.     Plan Discussed with:   Anesthesia Plan Comments:        discussed failed block and pdph as more likely side effects than in patient's with non-operative back and also discussed intrathecal catheter as an alternative. Anesthesia Quick Evaluation

## 2013-07-01 ENCOUNTER — Encounter (HOSPITAL_COMMUNITY): Payer: Medicaid Other | Admitting: Anesthesiology

## 2013-07-01 ENCOUNTER — Inpatient Hospital Stay (HOSPITAL_COMMUNITY): Payer: Medicaid Other | Admitting: Anesthesiology

## 2013-07-01 MED ORDER — MISOPROSTOL 200 MCG PO TABS
50.0000 ug | ORAL_TABLET | ORAL | Status: DC
  Administered 2013-07-01: 50 ug via ORAL

## 2013-07-01 MED ORDER — FENTANYL 2.5 MCG/ML BUPIVACAINE 1/10 % EPIDURAL INFUSION (WH - ANES)
7.0000 mL/h | INTRAMUSCULAR | Status: DC | PRN
  Administered 2013-07-01: 14 mL/h via EPIDURAL
  Administered 2013-07-02: 7 mL/h via EPIDURAL
  Filled 2013-07-01 (×2): qty 125

## 2013-07-01 MED ORDER — LIDOCAINE HCL (PF) 1 % IJ SOLN
INTRAMUSCULAR | Status: DC | PRN
  Administered 2013-07-01 (×2): 9 mL

## 2013-07-01 MED ORDER — LACTATED RINGERS IV SOLN
500.0000 mL | Freq: Once | INTRAVENOUS | Status: AC
  Administered 2013-07-01: 500 mL via INTRAVENOUS

## 2013-07-01 MED ORDER — PHENYLEPHRINE 40 MCG/ML (10ML) SYRINGE FOR IV PUSH (FOR BLOOD PRESSURE SUPPORT)
80.0000 ug | PREFILLED_SYRINGE | INTRAVENOUS | Status: DC | PRN
  Filled 2013-07-01: qty 2

## 2013-07-01 MED ORDER — FENTANYL 2.5 MCG/ML BUPIVACAINE 1/10 % EPIDURAL INFUSION (WH - ANES)
INTRAMUSCULAR | Status: DC | PRN
  Administered 2013-07-01: 14 mL/h via EPIDURAL

## 2013-07-01 MED ORDER — FENTANYL 2.5 MCG/ML BUPIVACAINE 1/10 % EPIDURAL INFUSION (WH - ANES)
INTRAMUSCULAR | Status: AC
  Filled 2013-07-01: qty 125

## 2013-07-01 MED ORDER — MISOPROSTOL 25 MCG QUARTER TABLET
50.0000 ug | ORAL_TABLET | Freq: Once | ORAL | Status: AC
  Administered 2013-07-01: 50 ug via ORAL

## 2013-07-01 MED ORDER — DIPHENHYDRAMINE HCL 50 MG/ML IJ SOLN: 12.5000 mg | INTRAMUSCULAR | Status: DC | PRN

## 2013-07-01 MED ORDER — OXYTOCIN 40 UNITS IN LACTATED RINGERS INFUSION - SIMPLE MED
1.0000 m[IU]/min | INTRAVENOUS | Status: DC
  Administered 2013-07-01: 2 m[IU]/min via INTRAVENOUS
  Administered 2013-07-01: 12 m[IU]/min via INTRAVENOUS

## 2013-07-01 MED ORDER — EPHEDRINE 5 MG/ML INJ
INTRAVENOUS | Status: AC
  Filled 2013-07-01: qty 4

## 2013-07-01 MED ORDER — PROMETHAZINE HCL 25 MG PO TABS
12.5000 mg | ORAL_TABLET | Freq: Four times a day (QID) | ORAL | Status: DC | PRN
  Administered 2013-07-01: 12.5 mg via ORAL
  Filled 2013-07-01: qty 1

## 2013-07-01 MED ORDER — EPHEDRINE 5 MG/ML INJ
10.0000 mg | INTRAVENOUS | Status: DC | PRN
Start: 2013-07-01 — End: 2013-07-02
  Filled 2013-07-01: qty 2

## 2013-07-01 MED ORDER — TERBUTALINE SULFATE 1 MG/ML IJ SOLN: 0.2500 mg | Freq: Once | INTRAMUSCULAR | Status: AC | PRN

## 2013-07-01 MED ORDER — PROMETHAZINE HCL 25 MG/ML IJ SOLN
12.5000 mg | Freq: Four times a day (QID) | INTRAMUSCULAR | Status: DC | PRN
  Administered 2013-07-01 – 2013-07-02 (×2): 12.5 mg via INTRAVENOUS
  Filled 2013-07-01 (×2): qty 1

## 2013-07-01 MED ORDER — EPHEDRINE 5 MG/ML INJ
10.0000 mg | INTRAVENOUS | Status: DC | PRN
  Filled 2013-07-01: qty 2

## 2013-07-01 MED ORDER — PHENYLEPHRINE 40 MCG/ML (10ML) SYRINGE FOR IV PUSH (FOR BLOOD PRESSURE SUPPORT)
PREFILLED_SYRINGE | INTRAVENOUS | Status: AC
  Filled 2013-07-01: qty 10

## 2013-07-01 NOTE — Progress Notes (Signed)
Daisy Tucker is a 38 y.o. G3P1011 at [redacted]w[redacted]d admitted for induction of labor due to Post dates. Due date 2/6.  Subjective: Feeling comfortable, unable to move legs, foley bulb out   Objective: BP 113/74  Pulse 80  Temp(Src) 97.7 F (36.5 C) (Oral)  Resp 20  Ht 5\' 9"  (1.753 m)  Wt 116.574 kg (257 lb)  BMI 37.93 kg/m2  SpO2 99%  LMP 09/16/2012   Total I/O In: -  Out: 900 [Urine:900]  FHT:  FHR: 120 bpm, variability: moderate,  accelerations:  Present,  decelerations:  Present rare variables UC:   regular, every 5 minutes SVE:   Dilation: 5 Effacement (%): 70 Station: -3 Exam by:: Dr. Sherril Cong  Labs: Lab Results  Component Value Date   WBC 8.6 06/30/2013   HGB 11.8* 06/30/2013   HCT 33.5* 06/30/2013   MCV 85.5 06/30/2013   PLT 199 06/30/2013    Assessment / Plan: Induction for post-dates, s/p foley bulb  Labor: Progressing normally, start pitocin Preeclampsia:  no signs or symptoms of toxicity Fetal Wellbeing:  Category I Pain Control:  Epidural I/D:  n/a Anticipated MOD:  NSVD  [name] 07/01/2013, 5:16 PM

## 2013-07-01 NOTE — Anesthesia Procedure Notes (Signed)
Epidural Patient location during procedure: OB Start time: 07/01/2013 12:24 PM End time: 07/01/2013 12:28 PM  Staffing Anesthesiologist: Lyn Hollingshead Performed by: anesthesiologist   Preanesthetic Checklist Completed: patient identified, surgical consent, pre-op evaluation, timeout performed, IV checked, risks and benefits discussed and monitors and equipment checked  Epidural Patient position: sitting Prep: site prepped and draped and DuraPrep Patient monitoring: continuous pulse ox and blood pressure Approach: midline Injection technique: LOR air  Needle:  Needle type: Tuohy  Needle gauge: 17 G Needle length: 9 cm and 9 Needle insertion depth: 6 cm Catheter type: closed end flexible Catheter size: 19 Gauge Catheter at skin depth: 11 cm Test dose: negative and Other  Assessment Sensory level: T9 Events: blood not aspirated, injection not painful, no injection resistance, negative IV test and no paresthesia  Additional Notes Reason for block:procedure for pain

## 2013-07-01 NOTE — Progress Notes (Signed)
Daisy Tucker is a 38 y.o. G3P1011 at [redacted]w[redacted]d admitted for induction of labor due to Post dates. Due date 2/6.  Subjective: Completely numb and unable to move following epidural, unaware of contractions.  Objective: BP 113/77  Pulse 68  Temp(Src) 97.7 F (36.5 C) (Oral)  Resp 20  Ht 5\' 9"  (1.753 m)  Wt 116.574 kg (257 lb)  BMI 37.93 kg/m2  SpO2 99%  LMP 09/16/2012 I/O last 3 completed shifts: In: -  Out: 900 [Urine:900]    FHT:  FHR: 145 bpm, variability: moderate,  accelerations:  Present,  decelerations:  Absent UC:   regular, every 2-3 minutes SVE:   Dilation: 5 Effacement (%): 80 Station: -2 Exam by:: Dr. Sherril Tucker  Labs: Lab Results  Component Value Date   WBC 8.6 06/30/2013   HGB 11.8* 06/30/2013   HCT 33.5* 06/30/2013   MCV 85.5 06/30/2013   PLT 199 06/30/2013    Assessment / Plan: Induction of labor due to postterm,  progressing well on pitocin  Labor: Progressing on Pitocin, will continue to increase then AROM Preeclampsia:  no signs or symptoms of toxicity Fetal Wellbeing:  Category I Pain Control:  Epidural I/D:  n/a Anticipated MOD:  NSVD  Daisy Tucker 07/01/2013, 8:39 PM

## 2013-07-01 NOTE — Progress Notes (Signed)
I have seen and examined this patient and I agree with the above. Serita Grammes 4:25 AM 07/01/2013

## 2013-07-01 NOTE — Progress Notes (Signed)
Caleb Prigmore Epstein is a 38 y.o. G3P1011 at [redacted]w[redacted]d by LMP and 23 wk ultrasound LMP c/w 23 week ultrasound admitted for induction of labor due to Post dates. .  Subjective: Patient states that contractions hurt (6/10) but she does not need pain meds at this time.  Objective: BP 110/77  Pulse 80  Temp(Src) 97.9 F (36.6 C) (Oral)  Resp 20  Ht 5\' 9"  (1.753 m)  Wt 116.574 kg (257 lb)  BMI 37.93 kg/m2  LMP 09/16/2012      FHT:  FHR: 140 bpm, variability: moderate,  accelerations:  Present,  decelerations:  Absent UC:   irregular, every 2-4 minutes SVE:   Dilation: 1 Effacement (%): Thick Station: Ballotable Exam by:: Lunah Losasso SNM  Labs: Lab Results  Component Value Date   WBC 8.6 06/30/2013   HGB 11.8* 06/30/2013   HCT 33.5* 06/30/2013   MCV 85.5 06/30/2013   PLT 199 06/30/2013    Assessment / Plan: Augmentation of labor with oral cytotec Cervix is still very long. Unable to determine if interior os is open.  Approx. 3cm of interior cervix palpable and cannot reach the internal os.  Labor: Continue to administer cytotec as ordered until cervix is favorable for foley bulb Preeclampsia:  no signs or symptoms of toxicity Fetal Wellbeing:  Category I Pain Control:  Labor support without medications I/D:  n/a Anticipated MOD:  NSVD  Ivin Booty 07/01/2013, 2:10 AM

## 2013-07-01 NOTE — Progress Notes (Signed)
Daisy Tucker is a 38 y.o. G3P1011 at [redacted]w[redacted]d   Subjective: Mostly comfortable; not feeling ctx  Objective: BP 126/83  Pulse 85  Temp(Src) 98 F (36.7 C) (Oral)  Resp 20  Ht 5\' 9"  (1.753 m)  Wt 116.574 kg (257 lb)  BMI 37.93 kg/m2  LMP 09/16/2012      FHT:  FHR: 120-130 bpm, variability: moderate,  accelerations:  Present,  decelerations:  Absent UC:   irregular, every 2-6 minutes SVE:   Dilation: 1 Effacement (%): 20 Station: -3 Exam by:: Bra[place] SNM-  Foley with stylet attempted but unable to get through int os  Labs: Lab Results  Component Value Date   WBC 8.6 06/30/2013   HGB 11.8* 06/30/2013   HCT 33.5* 06/30/2013   MCV 85.5 06/30/2013   PLT 199 06/30/2013    Assessment / Plan: IOL process Unfavorable cx  Will give cytotec 50po now and possibly try foley again later  Serita Grammes 07/01/2013, 7:19 AM

## 2013-07-01 NOTE — Progress Notes (Signed)
Daisy Tucker is a 38 y.o. G3P1011 at [redacted]w[redacted]d admitted for induction of labor due to Post dates. Due date 2/6.  Subjective: Pt complained of pain after placement of FB, unable to receive IV pain meds, epidural placed, reports relief   Objective: BP 113/65  Pulse 82  Temp(Src) 97.5 F (36.4 C) (Oral)  Resp 18  Ht 5\' 9"  (1.753 m)  Wt 116.574 kg (257 lb)  BMI 37.93 kg/m2  SpO2 99%  LMP 09/16/2012      FHT:  FHR: 115 bpm, variability: moderate,  accelerations:  Present,  decelerations:  Absent UC:   irregular, every 4-7 minutes SVE:   Dilation: 1 Effacement (%): 50 Station: -3 Exam by:: resident  Labs: Lab Results  Component Value Date   WBC 8.6 06/30/2013   HGB 11.8* 06/30/2013   HCT 33.5* 06/30/2013   MCV 85.5 06/30/2013   PLT 199 06/30/2013    Assessment / Plan: Induction for post-dates, s/p cytotec x4, foley bulbs attempted last night and early this am without success  Labor: Progressing normally, FB placed about 12pm Preeclampsia:  no signs or symptoms of toxicity Fetal Wellbeing:  Category I Pain Control:  Epidural I/D:  n/a Anticipated MOD:  NSVD  [name] 07/01/2013, 2:43 PM

## 2013-07-02 ENCOUNTER — Encounter (HOSPITAL_COMMUNITY): Payer: Self-pay

## 2013-07-02 DIAGNOSIS — O360990 Maternal care for other rhesus isoimmunization, unspecified trimester, not applicable or unspecified: Secondary | ICD-10-CM

## 2013-07-02 DIAGNOSIS — O480 Post-term pregnancy: Secondary | ICD-10-CM

## 2013-07-02 DIAGNOSIS — O09529 Supervision of elderly multigravida, unspecified trimester: Secondary | ICD-10-CM

## 2013-07-02 MED ORDER — TERBUTALINE SULFATE 1 MG/ML IJ SOLN
0.2500 mg | Freq: Once | INTRAMUSCULAR | Status: AC
  Administered 2013-07-02: 0.25 mg via SUBCUTANEOUS

## 2013-07-02 MED ORDER — ONDANSETRON HCL 4 MG/2ML IJ SOLN: 4.0000 mg | INTRAMUSCULAR | Status: DC | PRN

## 2013-07-02 MED ORDER — TERBUTALINE SULFATE 1 MG/ML IJ SOLN
INTRAMUSCULAR | Status: AC
  Filled 2013-07-02: qty 1

## 2013-07-02 MED ORDER — DIBUCAINE 1 % RE OINT: 1.0000 "application " | TOPICAL_OINTMENT | RECTAL | Status: DC | PRN

## 2013-07-02 MED ORDER — PRENATAL MULTIVITAMIN CH
1.0000 | ORAL_TABLET | Freq: Every day | ORAL | Status: DC
  Filled 2013-07-02: qty 1

## 2013-07-02 MED ORDER — NYSTATIN 100000 UNIT/GM EX POWD
1.0000 g | Freq: Two times a day (BID) | CUTANEOUS | Status: DC | PRN
  Administered 2013-07-02: 1 g via TOPICAL
  Filled 2013-07-02: qty 15

## 2013-07-02 MED ORDER — WITCH HAZEL-GLYCERIN EX PADS: 1.0000 "application " | MEDICATED_PAD | CUTANEOUS | Status: DC | PRN

## 2013-07-02 MED ORDER — SIMETHICONE 80 MG PO CHEW: 80.0000 mg | CHEWABLE_TABLET | ORAL | Status: DC | PRN

## 2013-07-02 MED ORDER — LACTATED RINGERS IV SOLN
INTRAVENOUS | Status: DC
  Administered 2013-07-02 (×2): via INTRAUTERINE

## 2013-07-02 MED ORDER — IBUPROFEN 600 MG PO TABS
600.0000 mg | ORAL_TABLET | Freq: Four times a day (QID) | ORAL | Status: DC
  Administered 2013-07-02 – 2013-07-03 (×5): 600 mg via ORAL
  Filled 2013-07-02 (×5): qty 1

## 2013-07-02 MED ORDER — LANOLIN HYDROUS EX OINT: TOPICAL_OINTMENT | CUTANEOUS | Status: DC | PRN

## 2013-07-02 MED ORDER — OXYCODONE-ACETAMINOPHEN 5-325 MG PO TABS
1.0000 | ORAL_TABLET | ORAL | Status: DC | PRN
Start: 2013-07-02 — End: 2013-07-03
  Administered 2013-07-02: 1 via ORAL
  Administered 2013-07-03 (×3): 2 via ORAL
  Filled 2013-07-02: qty 1
  Filled 2013-07-02 (×3): qty 2

## 2013-07-02 MED ORDER — BENZOCAINE-MENTHOL 20-0.5 % EX AERO: 1.0000 "application " | INHALATION_SPRAY | CUTANEOUS | Status: DC | PRN

## 2013-07-02 MED ORDER — ONDANSETRON HCL 4 MG PO TABS: 4.0000 mg | ORAL_TABLET | ORAL | Status: DC | PRN

## 2013-07-02 MED ORDER — PANTOPRAZOLE SODIUM 40 MG PO TBEC
40.0000 mg | DELAYED_RELEASE_TABLET | Freq: Every day | ORAL | Status: DC
  Administered 2013-07-02 – 2013-07-03 (×2): 40 mg via ORAL
  Filled 2013-07-02 (×4): qty 1

## 2013-07-02 MED ORDER — DIPHENHYDRAMINE HCL 25 MG PO CAPS: 25.0000 mg | ORAL_CAPSULE | Freq: Four times a day (QID) | ORAL | Status: DC | PRN

## 2013-07-02 MED ORDER — SENNOSIDES-DOCUSATE SODIUM 8.6-50 MG PO TABS
2.0000 | ORAL_TABLET | ORAL | Status: DC
  Administered 2013-07-02: 2 via ORAL
  Filled 2013-07-02: qty 2

## 2013-07-02 MED ORDER — TETANUS-DIPHTH-ACELL PERTUSSIS 5-2.5-18.5 LF-MCG/0.5 IM SUSP: 0.5000 mL | Freq: Once | INTRAMUSCULAR | Status: DC

## 2013-07-02 MED ORDER — ZOLPIDEM TARTRATE 5 MG PO TABS
5.0000 mg | ORAL_TABLET | Freq: Every evening | ORAL | Status: DC | PRN
Start: 2013-07-02 — End: 2013-07-03

## 2013-07-02 NOTE — Lactation Note (Signed)
This note was copied from the chart of Daisy Tucker. Lactation Consultation Note  Patient Name: Daisy Tucker [id number] Date: 07/02/2013 Reason for consult: Initial assessment;Other (Comment) (charting for exclusion).  Struble spoke with Al Corpus, RN from [facility] who informed LC that mom taking klonipin and subutex and wants to breastfeed.  Based on information from [employer] "Medications and Mother's Milk" 2014 edition, LC recommends the decision is up to Pediatrician because of cautions regarding klonipin possibly causing poor feeding, apnea and sedation.  LC will defer speaking with mom until decision of whether breastfeeding is permitted.   Maternal Data Formula Feeding for Exclusion: Yes Reason for exclusion: Substance abuse and/or alcohol abuse;Taking certain medications (mom on klonipin, L-3 with cautions regarding apnea and poor feeding)  Feeding    LATCH Score/Interventions                      Lactation Tools Discussed/Used   Discussed maternal medications with RN, Al Corpus and will await follow-up based on Pediatrician recommendation (L-3 medication: klonipin)  Consult Status Consult Status: PRN    Bernita Buffy 07/02/2013, 9:19 PM

## 2013-07-02 NOTE — Progress Notes (Signed)
Patient ID: Daisy Tucker, female   DOB: [birth year], 38 y.o.   MRN: [id number] Daisy Tucker is a 38 y.o. G3P1011 at [redacted]w[redacted]d admitted for induction of labor due to Post dates.   Subjective:   Objective: BP 123/80  Pulse 69  Temp(Src) 97.6 F (36.4 C) (Oral)  Resp 20  Ht 5\' 9"  (1.753 m)  Wt 116.574 kg (257 lb)  BMI 37.93 kg/m2  SpO2 99%  LMP 09/16/2012 I/O last 3 completed shifts: In: -  Out: 900 [Urine:900] Total I/O In: -  Out: 800 [Urine:800]  FHT:  FHR: 120 bpm, variability: moderate,  accelerations:  Present,  decelerations:  Present repetitive variables UC:   regular, every 1-3 minutes SVE:   Dilation: 4.5 Effacement (%): 70 Station: -2 Exam by:: [employer]: Lab Results  Component Value Date   WBC 8.6 06/30/2013   HGB 11.8* 06/30/2013   HCT 33.5* 06/30/2013   MCV 85.5 06/30/2013   PLT 199 06/30/2013    Assessment / Plan: Induction of labor due to post term,  Slow progres on pitocin. Pt has been having repetitive variables, advised positional changes and  amnioinfusion Will monitor for resolution  Labor: Progressing on Pitocin, will continue to increase. Fetal Wellbeing:  Category II Pain Control:  Epidural I/D:  GBS  negative Anticipated MOD:  NSVD  Daisy Tucker 07/02/2013, 3:40 AM

## 2013-07-02 NOTE — Progress Notes (Signed)
Patient ID: Daisy Tucker, female   DOB: 1975-11-11, 38 y.o.   MRN: [id number] Daisy Tucker is a 37 y.o. G3P1011 at [redacted]w[redacted]d  admitted for induction of labor due to Post dates.   Subjective: Comfortable  Objective: BP 106/56  Pulse 101  Temp(Src) 98 F (36.7 C) (Axillary)  Resp 20  Ht 5\' 9"  (1.753 m)  Wt 116.574 kg (257 lb)  BMI 37.93 kg/m2  SpO2 100%  LMP 09/16/2012 Total I/O In: -  Out: 1400 [Urine:1400]  FHT:  FHR: 120 bpm, variability: moderate,  accelerations:  Present,  decelerations:  Present prolonged x 1 w/ position change from lateral to semi-fowlers, resolved w/ change to Rt lateral UC:   irregular, every 4-9 minutes  SVE:   Dilation: 6.5 Effacement (%): 80 Station: 0 Exam by:: [employer] CNM  Pitocin: off  Labs: Lab Results  Component Value Date   WBC 8.6 06/30/2013   HGB 11.8* 06/30/2013   HCT 33.5* 06/30/2013   MCV 85.5 06/30/2013   PLT 199 06/30/2013    Assessment / Plan: IOL d/t postdates, fhr had been doing well until prolonged decel w/ position change to semifowler that resolved w/ position change to Rt lateral  Labor: active phase, pitocin currently off, will restart after 63min reassuring fhr Fetal Wellbeing:  Category II Pain Control:  Epidural Pre-eclampsia: n/a I/D:  n/a Anticipated MOD:  NSVD  Tawnya Crook CNM, WHNP-BC 07/02/2013, 6:55 AM

## 2013-07-02 NOTE — Progress Notes (Signed)
Patient ID: Daisy Tucker, female   DOB: 06/20/1975, 38 y.o.   MRN: [id number] Daisy Tucker is a 38 y.o. G3P1011 at [redacted]w[redacted]d  admitted for induction of labor due to Post dates.   Subjective: Pt is comfortable on epidural, not feeling contractions.     Objective: BP 99/63  Pulse 72  Temp(Src) 97.5 F (36.4 C) (Oral)  Resp 20  Ht 5\' 9"  (1.753 m)  Wt 116.574 kg (257 lb)  BMI 37.93 kg/m2  SpO2 99%  LMP 09/16/2012 I/O last 3 completed shifts: In: -  Out: 900 [Urine:900] Total I/O In: -  Out: 800 [Urine:800]  FHT:  FHR: 120 bpm, variability: moderate,  accelerations:  Present,  decelerations:  Absent UC:   regular, every 1-2 minutes SVE:   Dilation: 4.5 Effacement (%): 20 Station: -2 Exam by:: [employer] CNM  Labs: Lab Results  Component Value Date   WBC 8.6 06/30/2013   HGB 11.8* 06/30/2013   HCT 33.5* 06/30/2013   MCV 85.5 06/30/2013   PLT 199 06/30/2013    Assessment / Plan: Induction of labor due to post date,  Slow progress on pitocin. AROM performed + increase rate of pitocin IUPC inserted to monitor strength of contractions  Labor: Progressing on Pitocin, will continue to increase. AROM performed Fetal Wellbeing:  Category I Pain Control:  Epidural I/D:  GBS negative Anticipated MOD:  NSVD  Daisy Tucker 07/02/2013, 12:16 AM

## 2013-07-02 NOTE — Progress Notes (Signed)
Variables getting worse after interventions including amnioinfusion. Pitocin off. FSE placed d/t not tracing fhr continuously. SVE: 6-7/80/-1 to 0. Dr. Hulan Fray called to evaluate strip. Ordered dose of terb. FHR recovering. Will leave pitocin off for now.   Roma Schanz, CNM, [facility] 07/02/2013 4:06 AM

## 2013-07-02 NOTE — Progress Notes (Signed)
I spoke with and examined patient and agree with medical student's note and plan of care.  Roma Schanz, CNM, [facility] 07/02/2013 3:23 AM

## 2013-07-03 LAB — CBC
HCT: 29 % — ABNORMAL LOW (ref 36.0–46.0)
Hemoglobin: 10 g/dL — ABNORMAL LOW (ref 12.0–15.0)
MCH: 29.5 pg (ref 26.0–34.0)
MCHC: 34.5 g/dL (ref 30.0–36.0)
MCV: 85.5 fL (ref 78.0–100.0)
Platelets: 174 10*3/uL (ref 150–400)
RBC: 3.39 MIL/uL — ABNORMAL LOW (ref 3.87–5.11)
RDW: 13.5 % (ref 11.5–15.5)
WBC: 10.5 10*3/uL (ref 4.0–10.5)

## 2013-07-03 MED ORDER — IBUPROFEN 600 MG PO TABS: 600.0000 mg | ORAL_TABLET | Freq: Four times a day (QID) | ORAL | Status: DC

## 2013-07-03 MED ORDER — RHO D IMMUNE GLOBULIN 1500 UNIT/2ML IJ SOLN
300.0000 ug | Freq: Once | INTRAMUSCULAR | Status: AC
  Administered 2013-07-03: 300 ug via INTRAMUSCULAR
  Filled 2013-07-03: qty 2

## 2013-07-03 NOTE — Lactation Note (Signed)
This note was copied from the chart of Daisy Tucker. Lactation Consultation Note Follow up consult:  [name] 44 hours old.  Reviewed hand expression and mother had good flow, milk transitioning.  Attempted to latch baby in cross cradle position but baby had moderate spit up and then fell asleep.  Mother states baby has been spitty.  Mother states she has not had her klonopin since L&D but FOB is bringing a low dose for her to start taking tonight.  Reviewed that it can make baby sleepy and to wake baby during the night if she goes 4-5 hours without breastfeeding.  Reviewed baby needs to nurse 8-12 times a day and engorgement care.  Encouraged mother to call for further assistance.  Patient Name: Daisy Shalice Woodring [id number] Date: 07/03/2013 Reason for consult: Follow-up assessment   Maternal Data Has patient been taught Hand Expression?: Yes Does the patient have breastfeeding experience prior to this delivery?: Yes  Feeding    LATCH Score/Interventions                      Lactation Tools Discussed/Used     Consult Status Consult Status: PRN    Carlye Grippe 07/03/2013, 3:04 PM

## 2013-07-03 NOTE — Discharge Summary (Signed)
Attestation of Attending Supervision of Advanced Practitioner (CNM/NP): Evaluation and management procedures were performed by the Advanced Practitioner under my supervision and collaboration.  I have reviewed the Advanced Practitioner's note and chart, and I agree with the management and plan.  Edgar Reisz 07/03/2013 7:51 AM   

## 2013-07-03 NOTE — Discharge Instructions (Signed)

## 2013-07-03 NOTE — Discharge Summary (Signed)
Obstetric Discharge Summary Reason for Admission: onset of labor Prenatal Procedures: ultrasound Intrapartum Procedures: vacuum Postpartum Procedures: none Complications-Operative and Postpartum: 2nd degree perineal laceration Hemoglobin  Date Value Ref Range Status  06/30/2013 11.8* 12.0 - 15.0 g/dL Final  10/01/2011 14.5  12.2 - 16.2 g/dL Final     HCT  Date Value Ref Range Status  06/30/2013 33.5* 36.0 - 46.0 % Final     HCT, POC  Date Value Ref Range Status  10/01/2011 43.9  37.7 - 47.9 % Final    Physical Exam:  General: alert, cooperative, appears stated age and no distress Lochia: appropriate Uterine Fundus: firm Incision: perineum intact DVT Evaluation: No evidence of DVT seen on physical exam. Negative Homan's sign. No cords or calf tenderness.  Discharge Diagnoses: Term Pregnancy-delivered  Discharge Information: Date: 07/03/2013 Activity: pelvic rest Diet: routine Medications: PNV, Ibuprofen and Percocet Condition: stable and improved Instructions: refer to practice specific booklet Discharge to: home   Newborn Data: Live born female  Birth Weight: 8 lb 0.6 oz (3645 g) APGAR: 8, 9  Home with mother.  Daisy Tucker DARLENE 07/03/2013, 7:28 AM

## 2013-07-03 NOTE — Addendum Note (Signed)
Addendum created 07/03/13 0756 by Talbot Grumbling, CRNA   Modules edited: Charges VN, Notes Section   Notes Section:  File: [id number]

## 2013-07-03 NOTE — Progress Notes (Signed)
Ur chart review completed.  

## 2013-07-03 NOTE — Anesthesia Postprocedure Evaluation (Signed)
Anesthesia Post Note  Patient: Daisy Tucker  Procedure(s) Performed: * No procedures listed *  Anesthesia type: Epidural  Patient location: Mother/Baby  Post pain: Pain level controlled  Post assessment: Post-op Vital signs reviewed  Last Vitals:  Filed Vitals:   07/03/13 0500  BP: 94/59  Pulse: 74  Temp: 36.4 C  Resp: 18    Post vital signs: Reviewed  Level of consciousness: awake  Complications: No apparent anesthesia complications

## 2013-07-03 NOTE — H&P (Signed)
Attestation of Attending Supervision of Obstetric Fellow: Evaluation and management procedures were performed by the Obstetric Fellow under my supervision and collaboration.  I have reviewed the Obstetric Fellow's note and chart, and I agree with the management and plan.  Kalim Kissel, MD, FACOG Attending Obstetrician & Gynecologist Faculty Practice, [facility]   
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
## 2013-07-04 ENCOUNTER — Ambulatory Visit: Payer: Self-pay

## 2013-07-04 LAB — RH IG WORKUP (INCLUDES ABO/RH)
ABO/RH(D): O NEG
Fetal Screen: NEGATIVE
Gestational Age(Wks): 41.2
Unit division: 0

## 2013-07-04 NOTE — Lactation Note (Signed)
This note was copied from the chart of Daisy Evan Osburn. Lactation Consultation Note Follow up consult:  Mother put baby to the breast in football position. LS 9. Actively sucking, swallows heard. Mother nipples pink and sore, provided comfort gels and reviewed use.  Reviewed deep wide latch. Reviewed engorgement care and breastfeeding 8-12 times a day. Encouraged mother to call for further assistance.  Patient Name: Daisy Tucker [id number] Date: 07/04/2013 Reason for consult: Follow-up assessment   Maternal Data    Feeding Feeding Type: Breast Fed Length of feed: 35 min  LATCH Score/Interventions Latch: Grasps breast easily, tongue down, lips flanged, rhythmical sucking.  Audible Swallowing: Spontaneous and intermittent Intervention(s): Hand expression Intervention(s): Alternate breast massage  Type of Nipple: Everted at rest and after stimulation  Comfort (Breast/Nipple): Filling, red/small blisters or bruises, mild/mod discomfort  Problem noted: Mild/Moderate discomfort Interventions (Mild/moderate discomfort): Comfort gels  Hold (Positioning): No assistance needed to correctly position infant at breast. Intervention(s): Support Pillows;Position options  LATCH Score: 9  Lactation Tools Discussed/Used     Consult Status Consult Status: PRN    Vivianne Master [facility] 07/04/2013, 8:45 AM

## 2013-07-04 NOTE — Progress Notes (Signed)
Reactive NST on 2/1/0/15

## 2013-08-08 ENCOUNTER — Encounter: Payer: Self-pay | Admitting: Physician Assistant

## 2013-08-08 ENCOUNTER — Ambulatory Visit: Payer: Self-pay | Admitting: Physician Assistant

## 2013-08-08 VITALS — BP 112/78 | HR 89 | Temp 98.3°F | Resp 16 | Ht 69.75 in | Wt 247.8 lb

## 2013-08-08 DIAGNOSIS — M545 Low back pain, unspecified: Secondary | ICD-10-CM

## 2013-08-08 DIAGNOSIS — I890 Lymphedema, not elsewhere classified: Secondary | ICD-10-CM

## 2013-08-08 DIAGNOSIS — F411 Generalized anxiety disorder: Secondary | ICD-10-CM

## 2013-08-08 DIAGNOSIS — R609 Edema, unspecified: Secondary | ICD-10-CM

## 2013-08-08 DIAGNOSIS — K219 Gastro-esophageal reflux disease without esophagitis: Secondary | ICD-10-CM

## 2013-08-08 MED ORDER — CLONAZEPAM 1 MG PO TABS: 1.0000 mg | ORAL_TABLET | Freq: Two times a day (BID) | ORAL | Status: DC | PRN

## 2013-08-08 MED ORDER — GABAPENTIN 300 MG PO CAPS: 900.0000 mg | ORAL_CAPSULE | Freq: Three times a day (TID) | ORAL | Status: DC

## 2013-08-08 MED ORDER — PROMETHAZINE HCL 25 MG PO TABS: 25.0000 mg | ORAL_TABLET | Freq: Three times a day (TID) | ORAL | Status: DC | PRN

## 2013-08-08 NOTE — Patient Instructions (Signed)
If you have not heard anything regarding the referrals (GI and Vascular) in 1 week, please contact our office.

## 2013-08-08 NOTE — Progress Notes (Signed)
Subjective:    Patient ID: Daisy Tucker, female    DOB: 12-Dec-1975, 38 y.o.   MRN: [id number]   PCP: Dax Murguia, PA-C  Chief Complaint  Patient presents with  . Follow-up    after baby  . Foot Swelling    both feet and legs swelling - would like referral for cscan  . Referral    would like referral for gastro problems  . Medication Refill    Medications, allergies, past medical history, surgical history, family history, social history and problem list reviewed and updated.  HPI  Since her last visit, her second child, a daughter, Daisy Tucker, has been born.  Her labor was prolonged and complicated, but she is healthy and the family is happily settled.  During her pregnancy, the patient reports that she reduced her use of clonazepam from TID (and wanting to increase to QID) to BID.  She's even found that some days she only needs one dose.  She hopes to continue moving in that direction.  She has restarted the Neurontin, and increased up to 900 mg TID, with significant improvement in her back pain.  She believes that the Neurontin has also helped reduce her anxiety.  She requests referral to specialists while she has health coverage: GI for chronic nausea that was present prior to the pregnancy, worsened during, and persists after delivery.  She uses Protonix and promethazine daily. Vascular for persistent lower extremity edema, RIGHT>LEFT, also present prior to and then worsened by, her recent pregnancy. Cardiology evaluated her during the pregnancy and ruled out cardiac causes. She had a normal ECHO.  Now that she has delivered, she needs additional work up. She describes the discomfort in the RIGHT leg as feeling "bruised."  Review of Systems No chest pain, SOB, HA, dizziness, vision change, N/V, diarrhea, constipation, dysuria, urinary urgency or frequency, rash.     Objective:   Physical Exam  Vitals reviewed. Constitutional: She is oriented to person, place, and time.  She appears well-developed and well-nourished. She is active and cooperative. No distress.  BP 112/78  Pulse 89  Temp(Src) 98.3 F (36.8 C) (Oral)  Resp 16  Ht 5' 9.75" (1.772 m)  Wt 247 lb 12.8 oz (112.401 kg)  BMI 35.80 kg/m2  SpO2 95%   Eyes: Conjunctivae are normal. No scleral icterus.  Neck: No thyromegaly present.  Cardiovascular: Normal rate, regular rhythm and normal heart sounds.   Pulmonary/Chest: Effort normal and breath sounds normal.  Musculoskeletal:       Right lower leg: She exhibits edema (1-2+ pitting from the foot to the knee).       Left lower leg: She exhibits edema (trace).  Lymphadenopathy:    She has no cervical adenopathy.  Neurological: She is alert and oriented to person, place, and time.  Skin: Skin is warm and dry.  Psychiatric: She has a normal mood and affect. Her behavior is normal.          Assessment & Plan:  1. Edema 2. Lymphedema RIGHT>LEFT - Ambulatory referral to Vascular Surgery  3. GAD (generalized anxiety disorder) Improved somewhat.  Continue her efforts to reduce clonazepam use. - clonazePAM (KLONOPIN) 1 MG tablet; Take 1 tablet (1 mg total) by mouth 2 (two) times daily as needed for anxiety.  Dispense: 60 tablet; Refill: 0  4. Low back pain radiating to right leg Much improved.  Continue current treatment. - gabapentin (NEURONTIN) 300 MG capsule; Take 3 capsules (900 mg total) by mouth 3 (three) times daily.  Dispense: 270 capsule; Refill: 5  5. GERD (gastroesophageal reflux disease) Continue Protonix and promethazine for now. - promethazine (PHENERGAN) 25 MG tablet; Take 1 tablet (25 mg total) by mouth every 8 (eight) hours as needed for nausea.  Dispense: 60 tablet; Refill: 2 - Ambulatory referral to Gastroenterology   Fara Chute, PA-C Physician Assistant-Certified [facility]

## 2013-08-09 ENCOUNTER — Ambulatory Visit: Payer: Medicaid Other | Admitting: Obstetrics & Gynecology

## 2013-08-09 ENCOUNTER — Telehealth: Payer: Self-pay

## 2013-08-09 NOTE — Telephone Encounter (Signed)
Called pt and left message that she missed her appt today and to please call the office to reschedule her appt.  

## 2013-08-14 ENCOUNTER — Other Ambulatory Visit: Payer: Self-pay | Admitting: *Deleted

## 2013-08-14 DIAGNOSIS — M7989 Other specified soft tissue disorders: Secondary | ICD-10-CM

## 2013-09-05 ENCOUNTER — Other Ambulatory Visit: Payer: Self-pay | Admitting: Physician Assistant

## 2013-09-05 ENCOUNTER — Other Ambulatory Visit: Payer: Self-pay | Admitting: Obstetrics & Gynecology

## 2013-09-08 ENCOUNTER — Other Ambulatory Visit: Payer: Self-pay | Admitting: Physician Assistant

## 2013-09-22 ENCOUNTER — Encounter: Payer: Self-pay | Admitting: Surgery

## 2013-09-25 ENCOUNTER — Encounter (HOSPITAL_COMMUNITY): Payer: Medicaid Other

## 2013-09-25 ENCOUNTER — Encounter: Payer: Medicaid Other | Admitting: Surgery

## 2013-10-06 ENCOUNTER — Other Ambulatory Visit: Payer: Self-pay | Admitting: Obstetrics & Gynecology

## 2013-10-06 ENCOUNTER — Other Ambulatory Visit: Payer: Self-pay | Admitting: Physician Assistant

## 2013-10-10 NOTE — Telephone Encounter (Signed)
Faxed

## 2013-11-07 ENCOUNTER — Encounter: Payer: Self-pay | Admitting: Physician Assistant

## 2013-11-07 ENCOUNTER — Ambulatory Visit (INDEPENDENT_AMBULATORY_CARE_PROVIDER_SITE_OTHER): Payer: Self-pay | Admitting: Physician Assistant

## 2013-11-07 VITALS — BP 126/80 | HR 82 | Temp 98.4°F | Resp 16 | Ht 69.0 in | Wt 262.0 lb

## 2013-11-07 DIAGNOSIS — B372 Candidiasis of skin and nail: Secondary | ICD-10-CM

## 2013-11-07 DIAGNOSIS — F411 Generalized anxiety disorder: Secondary | ICD-10-CM

## 2013-11-07 DIAGNOSIS — R609 Edema, unspecified: Secondary | ICD-10-CM

## 2013-11-07 DIAGNOSIS — M545 Low back pain, unspecified: Secondary | ICD-10-CM

## 2013-11-07 DIAGNOSIS — K219 Gastro-esophageal reflux disease without esophagitis: Secondary | ICD-10-CM

## 2013-11-07 DIAGNOSIS — M79604 Pain in right leg: Secondary | ICD-10-CM

## 2013-11-07 MED ORDER — NYSTATIN 100000 UNIT/GM EX POWD: CUTANEOUS | Status: DC

## 2013-11-07 MED ORDER — PANTOPRAZOLE SODIUM 40 MG PO TBEC: DELAYED_RELEASE_TABLET | ORAL | Status: DC

## 2013-11-07 MED ORDER — GABAPENTIN 300 MG PO CAPS: 900.0000 mg | ORAL_CAPSULE | Freq: Three times a day (TID) | ORAL | Status: DC

## 2013-11-07 MED ORDER — PROMETHAZINE HCL 25 MG PO TABS: 25.0000 mg | ORAL_TABLET | Freq: Three times a day (TID) | ORAL | Status: DC | PRN

## 2013-11-07 MED ORDER — CLONAZEPAM 1 MG PO TABS: ORAL_TABLET | ORAL | Status: DC

## 2013-11-07 NOTE — Patient Instructions (Addendum)
Call the vein/vascular office to reschedule your appointment.

## 2013-11-07 NOTE — Progress Notes (Signed)
Subjective:    Patient ID: Daisy Tucker, female    DOB: 05/01/1976, 38 y.o.   MRN: [id number]   PCP: Nikolaus Pienta, PA-C  Chief Complaint  Patient presents with  . Follow-up    3 mos, GAD, Edema  . Medication Refill    Clonazepam, Gabapentin, Nystatin, Protonix, Phenergan    Medications, allergies, past medical history, surgical history, family history, social history and problem list reviewed and updated.  Patient Active Problem List   Diagnosis Date Noted  . Tinea corporis 06/08/2013  . GERD (gastroesophageal reflux disease) 05/04/2013  . Edema 01/05/2013  . Low back pain radiating to right leg 09/29/2012  . GAD (generalized anxiety disorder) 02/04/2012    Prior to Admission medications   Medication Sig Start Date End Date Taking? Authorizing Provider  clonazePAM (KLONOPIN) 1 MG tablet TAKE ONE TABLET BY MOUTH TWICE DAILY AS NEEDED FOR ANXIETY   Yes Cassia Fein S Jyra Lagares, PA-C  gabapentin (NEURONTIN) 300 MG capsule Take 3 capsules (900 mg total) by mouth 3 (three) times daily. 08/08/13  Yes Miesha Bachmann S Lory Galan, PA-C  ibuprofen (ADVIL,MOTRIN) 600 MG tablet Take 1 tablet (600 mg total) by mouth every 6 (six) hours. 07/03/13  Yes Abbott Pao, CNM  nystatin (MYCOSTATIN) powder Apply 1 g topically 2 (two) times daily as needed (for yeast between breast).   Yes Historical Provider, MD  pantoprazole (PROTONIX) 40 MG tablet TAKE ONE TABLET BY MOUTH ONCE DAILY   Yes Woodroe Mode, MD  promethazine (PHENERGAN) 25 MG tablet Take 1 tablet (25 mg total) by mouth every 8 (eight) hours as needed for nausea. 08/08/13  Yes Cherisa Brucker Janalee Dane, PA-C    HPI  Presents for re-evaluation of LE edema and for prescription refills.  Her younger brother just died (of alcoholism) and her family is going to [facility] for the funeral.  Her two daughters accompany her today.  She confirms again today that her infant's pediatrician is aware of the medications that she is taking while breastfeeding.  She  had to cancel her appointment with vein specialist due to child care issues and hasn't rescheduled because the edema is significantly better than before.  However, she's noticed large veins in her abdomen and knows that her overall weight is contributing.  Review of Systems No CP, SOB, HA, dizziness, nausea, vomiting, GU symptoms.    Objective:   Physical Exam  Vitals reviewed. Constitutional: She is oriented to person, place, and time. Vital signs are normal. She appears well-developed and well-nourished. She is active and cooperative. No distress.  BP 126/80  Pulse 82  Temp(Src) 98.4 F (36.9 C) (Oral)  Resp 16  Ht 5\' 9"  (1.753 m)  Wt 262 lb (118.842 kg)  BMI 38.67 kg/m2  SpO2 95%  LMP 11/05/2013  Breastfeeding? Yes  HENT:  Head: Normocephalic and atraumatic.  Right Ear: Hearing normal.  Left Ear: Hearing normal.  Eyes: Conjunctivae are normal. No scleral icterus.  Neck: Normal range of motion. Neck supple. No thyromegaly present.  Cardiovascular: Normal rate, regular rhythm and normal heart sounds.   Pulses:      Radial pulses are 2+ on the right side, and 2+ on the left side.  Pulmonary/Chest: Effort normal and breath sounds normal.  Lymphadenopathy:       Head (right side): No tonsillar, no preauricular, no posterior auricular and no occipital adenopathy present.       Head (left side): No tonsillar, no preauricular, no posterior auricular and no occipital adenopathy present.  She has no cervical adenopathy.       Right: No supraclavicular adenopathy present.       Left: No supraclavicular adenopathy present.  Neurological: She is alert and oriented to person, place, and time. No sensory deficit.  Skin: Skin is warm, dry and intact. No rash noted. No cyanosis or erythema. Nails show no clubbing.  Psychiatric: She has a normal mood and affect.          Assessment & Plan:  1. GAD (generalized anxiety disorder) Stable. - clonazePAM (KLONOPIN) 1 MG tablet; TAKE ONE  TABLET BY MOUTH TWICE DAILY AS NEEDED FOR ANXIETY  Dispense: 60 tablet; Refill: 0  2. Low back pain radiating to right leg Stable. - gabapentin (NEURONTIN) 300 MG capsule; Take 3 capsules (900 mg total) by mouth 3 (three) times daily.  Dispense: 270 capsule; Refill: 5  3. Edema Improved.  Given the varicosities in the abdominal wall, I encourage her to reschedule with vein/vascular.  4. Gastroesophageal reflux disease without esophagitis Stable. - pantoprazole (PROTONIX) 40 MG tablet; TAKE ONE TABLET BY MOUTH ONCE DAILY  Dispense: 30 tablet; Refill: 5 - promethazine (PHENERGAN) 25 MG tablet; Take 1 tablet (25 mg total) by mouth every 8 (eight) hours as needed for nausea.  Dispense: 60 tablet; Refill: 2  5. Cutaneous candidiasis Stable. - nystatin (MYCOSTATIN) powder; Apply to affected area BID  Dispense: 60 g; Refill: 5  Return in about 6 months (around 05/09/2014) for re-evaluation of anxiety, reflux, etc..   Fara Chute, PA-C Physician Assistant-Certified [facility]

## 2013-11-09 ENCOUNTER — Ambulatory Visit: Payer: Self-pay | Admitting: Physician Assistant

## 2013-12-06 ENCOUNTER — Other Ambulatory Visit: Payer: Self-pay | Admitting: Physician Assistant

## 2013-12-08 NOTE — Telephone Encounter (Signed)
faxed

## 2014-01-04 ENCOUNTER — Other Ambulatory Visit: Payer: Self-pay | Admitting: Physician Assistant

## 2014-01-05 NOTE — Telephone Encounter (Signed)
Faxed

## 2014-02-02 ENCOUNTER — Other Ambulatory Visit: Payer: Self-pay | Admitting: Physician Assistant

## 2014-02-03 ENCOUNTER — Other Ambulatory Visit: Payer: Self-pay | Admitting: Physician Assistant

## 2014-02-03 NOTE — Telephone Encounter (Signed)
Klonopin called into pt's pharm.

## 2014-03-03 ENCOUNTER — Other Ambulatory Visit: Payer: Self-pay | Admitting: Physician Assistant

## 2014-03-05 ENCOUNTER — Other Ambulatory Visit: Payer: Self-pay | Admitting: Physician Assistant

## 2014-03-07 NOTE — Telephone Encounter (Signed)
Patient needs to see me in December or January.  Meds ordered this encounter  Medications  . promethazine (PHENERGAN) 25 MG tablet    Sig: TAKE ONE TABLET BY MOUTH EVERY 8 HOURS AS NEEDED FOR NAUSEA    Dispense:  60 tablet    Refill:  0  . clonazePAM (KLONOPIN) 1 MG tablet    Sig: TAKE ONE TABLET BY MOUTH TWICE DAILY AS NEEDED FOR ANXIETY    Dispense:  60 tablet    Refill:  0

## 2014-03-07 NOTE — Telephone Encounter (Signed)
Faxed. Notified pt sent and need for F/up on vm.

## 2014-03-19 ENCOUNTER — Encounter: Payer: Self-pay | Admitting: Physician Assistant

## 2014-04-03 ENCOUNTER — Other Ambulatory Visit: Payer: Self-pay | Admitting: Physician Assistant

## 2014-04-04 NOTE — Telephone Encounter (Signed)
Meds ordered this encounter  Medications  . clonazePAM (KLONOPIN) 1 MG tablet    Sig: TAKE ONE TABLET BY MOUTH TWICE DAILY AS NEEDED FOR ANXIETY    Dispense:  60 tablet    Refill:  0  . promethazine (PHENERGAN) 25 MG tablet    Sig: TAKE ONE TABLET BY MOUTH EVERY 8 HOURS AS NEEDED FOR NAUSEA    Dispense:  60 tablet    Refill:  0

## 2014-04-04 NOTE — Telephone Encounter (Signed)
Faxed

## 2014-04-19 ENCOUNTER — Encounter: Payer: Self-pay | Admitting: Obstetrics & Gynecology

## 2014-05-03 ENCOUNTER — Ambulatory Visit: Payer: Self-pay

## 2014-05-03 ENCOUNTER — Ambulatory Visit: Payer: Self-pay | Admitting: Physician Assistant

## 2014-05-07 ENCOUNTER — Telehealth: Payer: Self-pay | Admitting: Physician Assistant

## 2014-05-07 NOTE — Telephone Encounter (Signed)
Patient states that she was under the impression that she had a scheduled appointment with Chelle on 05/03/2014 at [facility] ([facility]). Patient stated she waited a long time only to find out that [facility] was not in that day and patient left not seen. Patient is needing a refill on Klonopin and Promethazine. Patient states she would come in to see Chelle today however she does not have a sitter for her daughter. Patient has an appointment with Chelle on 05/22/2014. Can she have these meds refilled just to get her through to her appointment date? Vladimir Faster on [address].  [phone]

## 2014-05-09 MED ORDER — CLONAZEPAM 1 MG PO TABS: 1.0000 mg | ORAL_TABLET | Freq: Two times a day (BID) | ORAL | Status: DC | PRN

## 2014-05-09 MED ORDER — PROMETHAZINE HCL 25 MG PO TABS: 25.0000 mg | ORAL_TABLET | Freq: Three times a day (TID) | ORAL | Status: DC | PRN

## 2014-05-09 NOTE — Telephone Encounter (Signed)
Faxed. Notified pt on VM

## 2014-05-09 NOTE — Telephone Encounter (Signed)
Meds ordered this encounter  Medications  . clonazePAM (KLONOPIN) 1 MG tablet    Sig: Take 1 tablet (1 mg total) by mouth 2 (two) times daily as needed for anxiety.    Dispense:  60 tablet    Refill:  0    Order Specific Question:  Supervising Provider    Answer:  DOOLITTLE, ROBERT P [[id number]]  . promethazine (PHENERGAN) 25 MG tablet    Sig: Take 1 tablet (25 mg total) by mouth every 8 (eight) hours as needed for nausea.    Dispense:  60 tablet    Refill:  0    Order Specific Question:  Supervising Provider    Answer:  DOOLITTLE, ROBERT P [[id number]]

## 2014-05-22 ENCOUNTER — Ambulatory Visit (INDEPENDENT_AMBULATORY_CARE_PROVIDER_SITE_OTHER): Payer: Self-pay

## 2014-05-22 ENCOUNTER — Ambulatory Visit (INDEPENDENT_AMBULATORY_CARE_PROVIDER_SITE_OTHER): Payer: Self-pay | Admitting: Emergency Medicine

## 2014-05-22 ENCOUNTER — Encounter: Payer: Self-pay | Admitting: Physician Assistant

## 2014-05-22 VITALS — BP 132/80 | HR 109 | Temp 98.1°F | Resp 16 | Ht 69.0 in | Wt 233.0 lb

## 2014-05-22 DIAGNOSIS — M545 Low back pain, unspecified: Secondary | ICD-10-CM

## 2014-05-22 DIAGNOSIS — M79604 Pain in right leg: Secondary | ICD-10-CM

## 2014-05-22 MED ORDER — HYDROCODONE-ACETAMINOPHEN 5-325 MG PO TABS: 1.0000 | ORAL_TABLET | Freq: Four times a day (QID) | ORAL | Status: DC | PRN

## 2014-05-22 MED ORDER — PREDNISONE 20 MG PO TABS: ORAL_TABLET | ORAL | Status: DC

## 2014-05-22 NOTE — Progress Notes (Signed)
Subjective:    Patient ID: Daisy Tucker, female    DOB: Jul 27, 1975, 39 y.o.   MRN: [id number]   PCP: Rollo Farquhar, PA-C  Chief Complaint  Patient presents with  . Follow-up    MEDICATION and problem with BACK after surgery 03/2007 having pain     Allergies  Allergen Reactions  . Aspirin Other (See Comments)    Stomach intolerance per pt.  . Zofran Hives and Swelling    Swelling local at site of injection.    Patient Active Problem List   Diagnosis Date Noted  . Tinea corporis 06/08/2013  . GERD (gastroesophageal reflux disease) 05/04/2013  . Edema 01/05/2013  . Low back pain radiating to right leg 09/29/2012  . GAD (generalized anxiety disorder) 02/04/2012    Prior to Admission medications   Medication Sig Start Date End Date Taking? Authorizing Provider  clonazePAM (KLONOPIN) 1 MG tablet Take 1 tablet (1 mg total) by mouth 2 (two) times daily as needed for anxiety. 05/09/14  Yes Draiden Mirsky S Kenith Trickel, PA-C  gabapentin (NEURONTIN) 300 MG capsule Take 3 capsules (900 mg total) by mouth 3 (three) times daily. 11/07/13  Yes Kea Callan S Nnaemeka Samson, PA-C  ibuprofen (ADVIL,MOTRIN) 600 MG tablet Take 1 tablet (600 mg total) by mouth every 6 (six) hours. 07/03/13  Yes Abbott Pao, CNM  nystatin (MYCOSTATIN) powder Apply to affected area BID 11/07/13  Yes Whittney Steenson S Lexie Morini, PA-C  promethazine (PHENERGAN) 25 MG tablet Take 1 tablet (25 mg total) by mouth every 8 (eight) hours as needed for nausea. 05/09/14  Yes Fara Chute, PA-C    Medical, Surgical, Family and Social History reviewed and updated.  HPI  "I'll cut tot he chase. I feel like I am dying."  "Back pain is getting so bad that by night time it locks up. I can't do anything. I just have to get on the floor." Pain shoots down the RIGHT leg to the ankle, "like the ankle is on fire." Can hardly move when she first wakes up. Takes about an hour to be able to move enough to get her 51 month old daughter up and get  ready. Relies heavily on her 68 year old daughter. Her husband works out of town during the week. Ibuprofen isn't helping at all. Neurontin was really helping, but isn't any more. "It's got to be something that I've done."  Several months ago, she called the office of the surgeon who did her lumbar fusion in 2008. Was told that there were no available appointments until 08/2014, so she scheduled for then but doesn't think she can make it.   Has tried ice, heat. In hands/knees position, pushing in the RIGHT buttock helps some.  No loss of bowel or bladder control, though she does have some stress incontinence since her second child was born. No saddle anesthesia. No numbness/tingling. No falls/weakness, but considerable pain. Is afraid that she cannot take care of her infant daughter in an emergency.   Review of Systems As above.    Objective:   Physical Exam  Constitutional: She is oriented to person, place, and time. She appears well-developed and well-nourished. No distress.  BP 132/80 mmHg  Pulse 109  Temp(Src) 98.1 F (36.7 C) (Oral)  Resp 16  Ht 5\' 9"  (1.753 m)  Wt 233 lb (105.688 kg)  BMI 34.39 kg/m2  SpO2 97%  LMP 04/23/2014  Breastfeeding? No   Eyes: Conjunctivae are normal. No scleral icterus.  Neck: Neck supple. No thyromegaly present.  Cardiovascular: Normal rate, regular rhythm, normal heart sounds and intact distal pulses.   Pulmonary/Chest: Effort normal and breath sounds normal.  Musculoskeletal:       Right hip: Normal.       Left hip: Normal.       Right knee: Normal.       Left knee: Normal.       Right ankle: Normal.       Left ankle: Normal.       Lumbar back: She exhibits decreased range of motion, tenderness, bony tenderness and pain. She exhibits no swelling, no edema, no deformity, no laceration, no spasm and normal pulse.       Right upper leg: Normal.       Left upper leg: Normal.       Right lower leg: Normal.       Left lower leg: Normal.        Right foot: Normal.       Left foot: Normal.  Lymphadenopathy:    She has no cervical adenopathy.  Neurological: She is alert and oriented to person, place, and time. She has normal strength. No cranial nerve deficit or sensory deficit. She displays a negative Romberg sign.  Reflex Scores:      Patellar reflexes are 2+ on the right side and 2+ on the left side.      Achilles reflexes are 2+ on the right side and 2+ on the left side. Positive SLR on the RIGHT.  Skin: Skin is warm and dry.  Psychiatric: She has a normal mood and affect. Her speech is normal and behavior is normal. She does not express inappropriate judgment.  Tearful. Unable to sit or stand comfortably in the room. Moves about during visit.      LS SPINE: [facility] reading (PRIMARY) by  Dr. Everlene Farrier. Previous lumbar fusion L5-S1 noted. Grade 1 anterolisthesis of L4 on L5.      Assessment & Plan:  1. Low back pain radiating to right leg Trial of prednisone. Hydrocodone for severe pain, primarily at night to help her get sleep. Needs re-evaluation with her neurosurgeon. - Ambulatory referral to Neurosurgery - predniSONE (DELTASONE) 20 MG tablet; Take 3 PO QAM x3days, 2 PO QAM x3days, 1 PO QAM x3days  Dispense: 18 tablet; Refill: 0 - HYDROcodone-acetaminophen (NORCO) 5-325 MG per tablet; Take 1-2 tablets by mouth every 6 (six) hours as needed for severe pain.  Dispense: 30 tablet; Refill: 0 - DG Lumbar Spine Complete; Future   Fara Chute, PA-C Physician Assistant-Certified [facility]

## 2014-05-29 ENCOUNTER — Telehealth: Payer: Self-pay

## 2014-05-29 NOTE — Telephone Encounter (Signed)
Patients wants to know if we can refill her medication for pain until she can see the neurosurgeon. She also wants to know if she can start another "round" of steroids since she takes the last one today.

## 2014-05-29 NOTE — Telephone Encounter (Signed)
Spoke to pt, she is awaiting appointment for neurosurgeon.   She would like less tylenol in her medication, it upsets her stomach.  Please advise

## 2014-05-30 MED ORDER — HYDROCODONE-ACETAMINOPHEN 10-325 MG PO TABS: 0.5000 | ORAL_TABLET | Freq: Four times a day (QID) | ORAL | Status: DC | PRN

## 2014-05-30 NOTE — Telephone Encounter (Signed)
What results did she get from the prednisone?  Generally, we do not refill it this soon.  I've increased the hydrocodone dose to 10, with the same 325 mg of Tylenol. This will reduce the Tylenol dose by half.  Meds ordered this encounter  Medications  . HYDROcodone-acetaminophen (NORCO) 10-325 MG per tablet    Sig: Take 0.5-1 tablets by mouth every 6 (six) hours as needed for severe pain.    Dispense:  30 tablet    Refill:  0    Order Specific Question:  Supervising Provider    Answer:  DOOLITTLE, ROBERT P [[id number]]

## 2014-05-30 NOTE — Telephone Encounter (Signed)
Spoke to pt, she feels prednisone helps more that hydrocodone. She will p/u the rx, and will cb if needed

## 2014-05-31 ENCOUNTER — Telehealth: Payer: Self-pay

## 2014-05-31 NOTE — Telephone Encounter (Signed)
Pt stopped in and wanted to know if Chelle could call in more Phenergan for her. Her stomach has been bothering her so she has been taking more and she needs more to get her through till 1/23. Thank you

## 2014-06-04 MED ORDER — PROMETHAZINE HCL 25 MG PO TABS: 25.0000 mg | ORAL_TABLET | Freq: Three times a day (TID) | ORAL | Status: DC | PRN

## 2014-06-04 NOTE — Telephone Encounter (Signed)
Meds ordered this encounter  Medications  . promethazine (PHENERGAN) 25 MG tablet    Sig: Take 1 tablet (25 mg total) by mouth every 8 (eight) hours as needed for nausea.    Dispense:  90 tablet    Refill:  0    Order Specific Question:  Supervising Provider    Answer:  DOOLITTLE, ROBERT P [[id number]]

## 2014-06-04 NOTE — Telephone Encounter (Signed)
Pt advised.

## 2014-06-07 ENCOUNTER — Other Ambulatory Visit: Payer: Self-pay | Admitting: Physician Assistant

## 2014-06-07 NOTE — Telephone Encounter (Signed)
Chelle, you just saw pt but don't see this med discussed. Do you want to give more RFs?

## 2014-06-18 ENCOUNTER — Telehealth: Payer: Self-pay | Admitting: Physician Assistant

## 2014-06-18 DIAGNOSIS — M79604 Pain in right leg: Secondary | ICD-10-CM

## 2014-06-18 DIAGNOSIS — M545 Low back pain, unspecified: Secondary | ICD-10-CM

## 2014-06-18 NOTE — Telephone Encounter (Addendum)
Left message on physician line. States I will receive a call back within 30 minutes.

## 2014-06-18 NOTE — Telephone Encounter (Signed)
Spoke with staff. Advised that the patient must call them directly to settle some business with them [name] or [name] in billing).  Once this is settled, she can be scheduled, and it appears that she CAN be seen sooner than 6 months out.  If she cannot work something out with them, let's refer her to [facility].

## 2014-06-26 NOTE — Telephone Encounter (Signed)
Spoke with patient, she says she contacted [facility] a while back and they told her it would be a few months out as well. She scheduled an appt at that time, which is 08/22/14.   Patient says the steroids she was rx at her last visit really helped and wants to know if she "can take another round" until her appointment date.

## 2014-06-27 MED ORDER — PREDNISONE 20 MG PO TABS: ORAL_TABLET | ORAL | Status: DC

## 2014-06-27 NOTE — Telephone Encounter (Signed)
Daisy Tucker, I spoke to pt. She was upset at the beginning because we were telling her that she needed to contact the facility herself to settle her issue. I told her that you did contact the facility. I informed her that she needs to speak with either [name] or [place] in billing. Once I informed her of that she was okay. She did not realize she could possibly have a balance with them. She is going to call and speak to billing to she what she can get settled.  She wanted to say Thank You for you calling them and working with her.

## 2014-06-27 NOTE — Telephone Encounter (Signed)
I've re-ordered the prednisone, but we won't be able to do it again before she's seen by [facility].  If she can setlle the issue with them, she may be able to get in BEFORE 08/2014. I encourage her to call them back and work that out.

## 2014-07-06 ENCOUNTER — Other Ambulatory Visit: Payer: Self-pay | Admitting: Obstetrics and Gynecology

## 2014-07-06 ENCOUNTER — Other Ambulatory Visit: Payer: Self-pay | Admitting: Physician Assistant

## 2014-07-06 NOTE — Telephone Encounter (Signed)
Patient notified via My Chart.  Rx printed.  Meds ordered this encounter  Medications  . clonazePAM (KLONOPIN) 1 MG tablet    Sig: TAKE ONE TABLET BY MOUTH TWICE DAILY AS NEEDED FOR ANXIETY    Dispense:  60 tablet    Refill:  0

## 2014-07-09 NOTE — Telephone Encounter (Signed)
Faxed

## 2014-07-11 ENCOUNTER — Other Ambulatory Visit: Payer: Self-pay | Admitting: Physician Assistant

## 2014-07-12 NOTE — Telephone Encounter (Signed)
Spoke with pt, advised Rx was sent in. 

## 2014-07-12 NOTE — Telephone Encounter (Signed)
Pt states that the pharmacy was supposed to send this in last week, would like to know if she could fill this asap.

## 2014-07-12 NOTE — Telephone Encounter (Signed)
Ph mes was sent to [facility], pending.

## 2014-07-12 NOTE — Telephone Encounter (Signed)
Do you want to give RFs?

## 2014-08-02 ENCOUNTER — Other Ambulatory Visit: Payer: Self-pay | Admitting: Physician Assistant

## 2014-08-06 NOTE — Telephone Encounter (Signed)
Faxed

## 2014-08-22 ENCOUNTER — Telehealth: Payer: Self-pay

## 2014-08-22 NOTE — Telephone Encounter (Signed)
Patient has an appointment tomorrow and would like to know it's okay with Chelle if she could follow up with her every 6 months instead of every three months. Patient states that she pays out of pocket and can't afford to keep scraping pennies and she'll never be able to pay off the balance if she sees Chelle every three months. Since her is appointment tomorrow 4/6 which is why this message is marked as high priority. Please call and advise! [phone]

## 2014-08-22 NOTE — Telephone Encounter (Signed)
1. Her appointment is Thursday 4/07 at 2:15 pm 2. That appointment should have been cancelled, since I am no longer available on Thursday afternoons, due to a standing weekly meeting. 3. As long as her condition remains stable, I am willing to reduce our follow-up visits to every 6 months.  Clinical: Please advise the patient of #3 above.  Kim/Scheduling Team: please make sure that my Thursday afternoon appointments have all be canceled!

## 2014-08-22 NOTE — Telephone Encounter (Signed)
Gave pt message.

## 2014-08-22 NOTE — Telephone Encounter (Signed)
chelle--FYI

## 2014-08-23 ENCOUNTER — Ambulatory Visit: Payer: Self-pay | Admitting: Physician Assistant

## 2014-08-30 NOTE — Telephone Encounter (Signed)
Patient needs copy of recent x-ray to take to spine doctor.   Please call [phone] when ready.   Needs to sign ROI when she comes in.

## 2014-08-31 NOTE — Telephone Encounter (Signed)
Request processed yesterday. In pick up drawer.

## 2014-09-04 ENCOUNTER — Other Ambulatory Visit: Payer: Self-pay | Admitting: Physician Assistant

## 2014-09-05 ENCOUNTER — Other Ambulatory Visit: Payer: Self-pay | Admitting: Physician Assistant

## 2014-09-05 NOTE — Telephone Encounter (Signed)
Chelle, do you want to give RFs on these meds?

## 2014-09-05 NOTE — Telephone Encounter (Signed)
Please remind patient that she needs an OV with me for the next fill.  Meds ordered this encounter  Medications  . promethazine (PHENERGAN) 25 MG tablet    Sig: TAKE ONE TABLET BY MOUTH EVERY 8 HOURS AS NEEDED FOR NAUSEA    Dispense:  90 tablet    Refill:  0  . clonazePAM (KLONOPIN) 1 MG tablet    Sig: TAKE ONE TABLET BY MOUTH TWICE DAILY AS NEEDED FOR ANXIETY    Dispense:  60 tablet    Refill:  0

## 2014-09-06 ENCOUNTER — Telehealth: Payer: Self-pay

## 2014-09-06 NOTE — Telephone Encounter (Signed)
Chelle, please review and advise when you need to see pt again. Your message to pt in 09/04/14 RF enc was to RTC for next month's RFs, but you had told pt in 08/22/14 telephone mes that she could decrease visits to every 6 mos. Is it OK for her to wait until July appt?

## 2014-09-06 NOTE — Telephone Encounter (Signed)
LMOM that pt needs f/up for more.

## 2014-09-06 NOTE — Telephone Encounter (Signed)
Pt missed a call from [place] regarding her rx refills. In the message pt was told to RTC within the next month or so. She has an appt scheduled in July and stated that she already cleared it will Chelle to be seen once every six months. So she just wants to make sure that it is still okay that she wait until July to come in.

## 2014-09-11 MED ORDER — HYDROCODONE-ACETAMINOPHEN 10-325 MG PO TABS: 0.5000 | ORAL_TABLET | Freq: Four times a day (QID) | ORAL | Status: DC | PRN

## 2014-09-11 MED ORDER — CLONAZEPAM 1 MG PO TABS: 1.0000 mg | ORAL_TABLET | Freq: Two times a day (BID) | ORAL | Status: DC | PRN

## 2014-09-11 NOTE — Telephone Encounter (Signed)
Yes. OK to wait until July.  Meds ordered this encounter  Medications  . HYDROcodone-acetaminophen (NORCO) 10-325 MG per tablet    Sig: Take 0.5-1 tablets by mouth every 6 (six) hours as needed for severe pain.    Dispense:  30 tablet    Refill:  0    Order Specific Question:  Supervising Provider    Answer:  DOOLITTLE, ROBERT P [[id number]]  . clonazePAM (KLONOPIN) 1 MG tablet    Sig: Take 1 tablet (1 mg total) by mouth 2 (two) times daily as needed. for anxiety    Dispense:  60 tablet    Refill:  0    Order Specific Question:  Supervising Provider    Answer:  DOOLITTLE, ROBERT P [[id number]]

## 2014-09-11 NOTE — Telephone Encounter (Signed)
Pt.notified

## 2014-10-04 ENCOUNTER — Other Ambulatory Visit: Payer: Self-pay | Admitting: Physician Assistant

## 2014-10-09 ENCOUNTER — Telehealth: Payer: Self-pay

## 2014-10-09 NOTE — Telephone Encounter (Addendum)
Pt states she had requested her Phenergan 25MG  and waiting to hear from anyone,. Please call [phone]    [facility] [place] [address]

## 2014-10-10 ENCOUNTER — Other Ambulatory Visit: Payer: Self-pay

## 2014-10-10 NOTE — Telephone Encounter (Signed)
lmom that i am faxing rx.

## 2014-10-10 NOTE — Telephone Encounter (Signed)
Pt would like a refill on promethazine (PHENERGAN) 25 MG tablet [[id number].  This is the pharmacy she would like [place] to [facility] on [address] Please advise at [phone]

## 2014-10-26 IMAGING — US US OB FOLLOW-UP
1 series · 12 of 28 positions shown · non-contrast
Comparison: none

[Series 1: us ob follow up · 33 acquisitions, 12 frames shown]
[im 2/33]
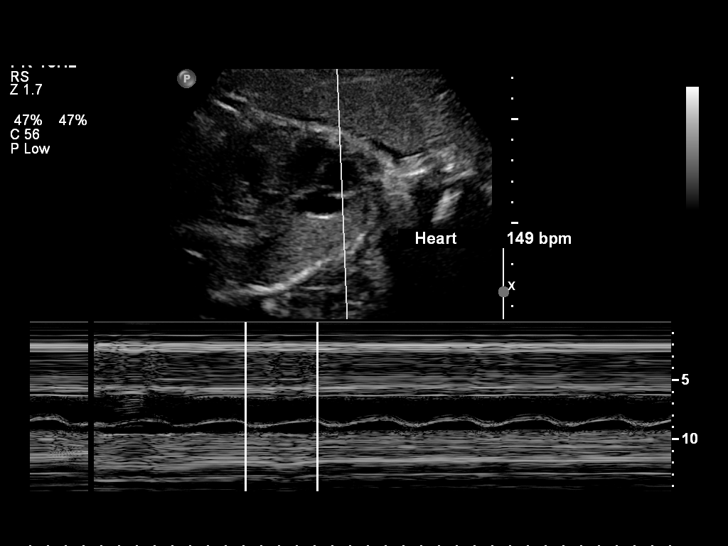
[im 4/33]
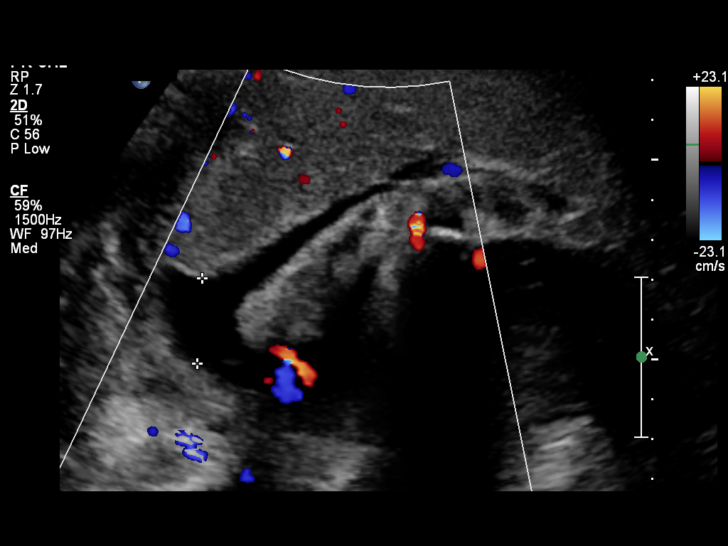
[im 6/33]
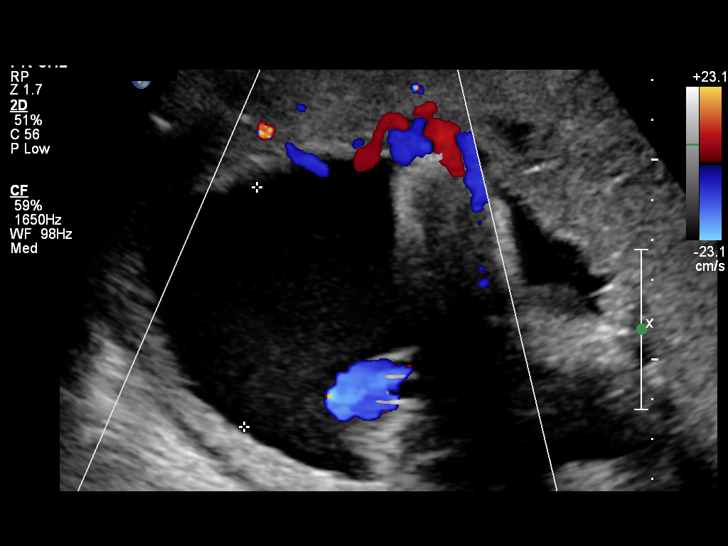
[im 10/33]
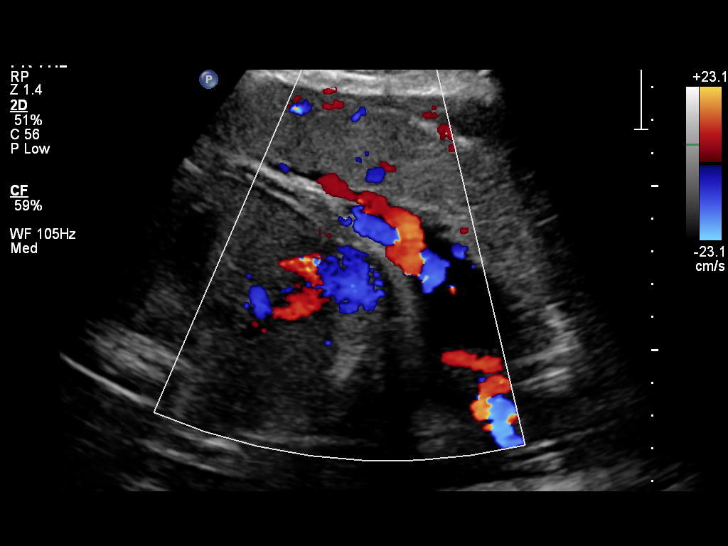
[im 12/33]
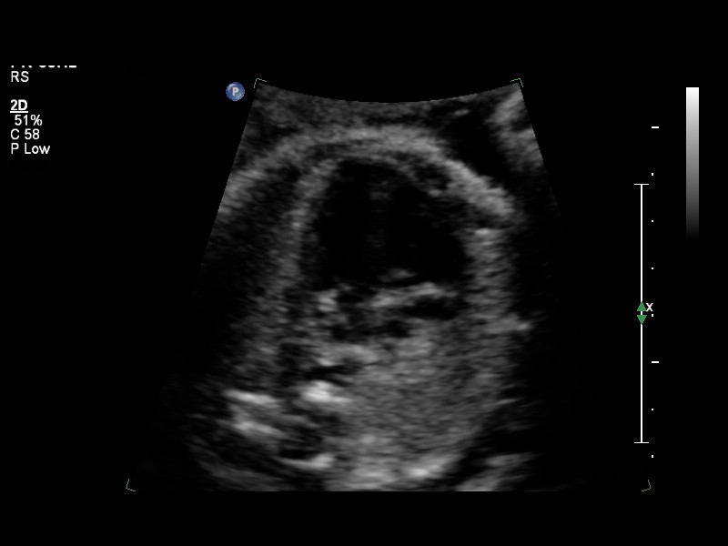
[im 15/33]
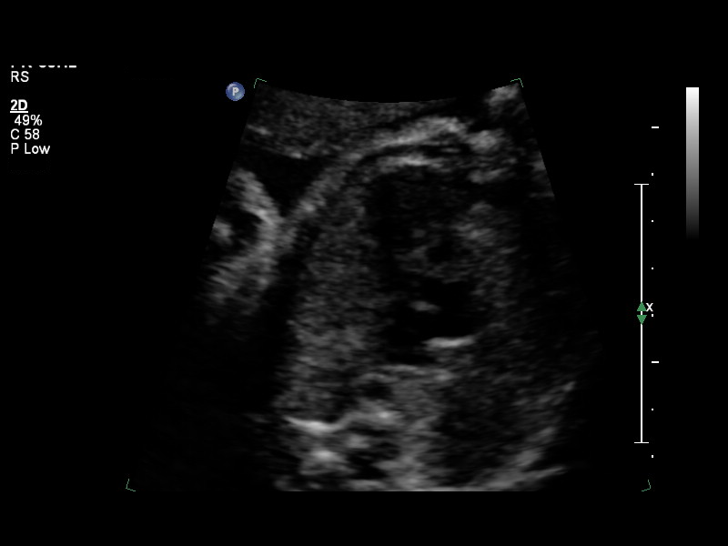
[im 18/33]
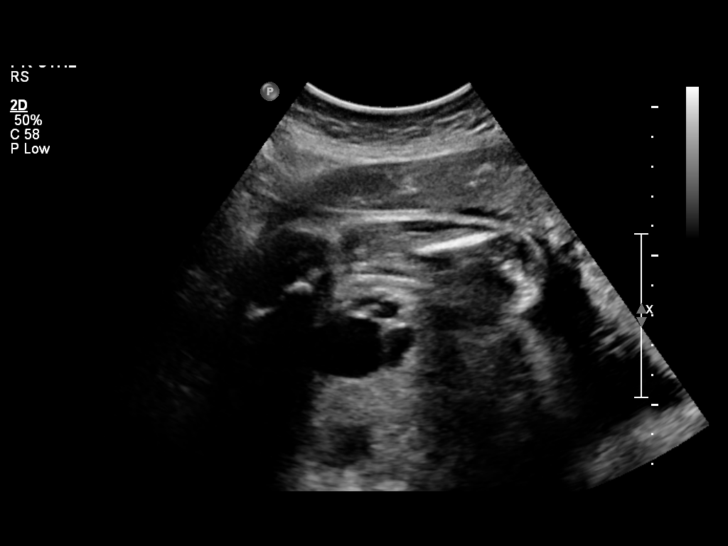
[im 21/33]
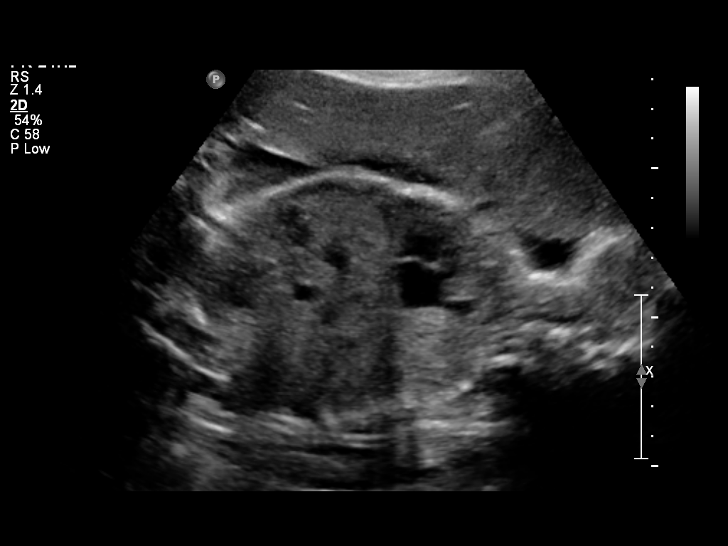
[im 23/33]
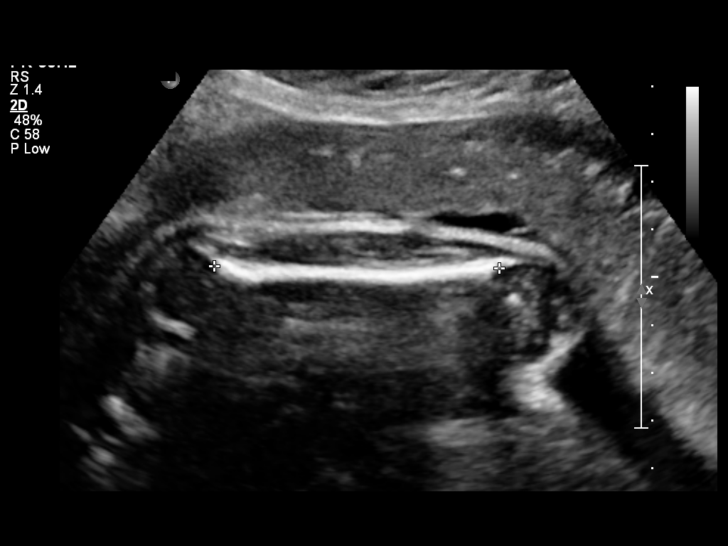
[im 27/33]
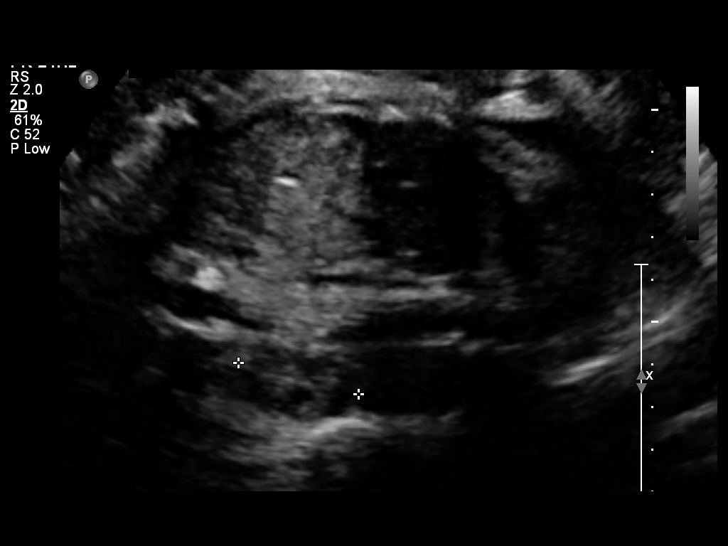
[im 29/33]
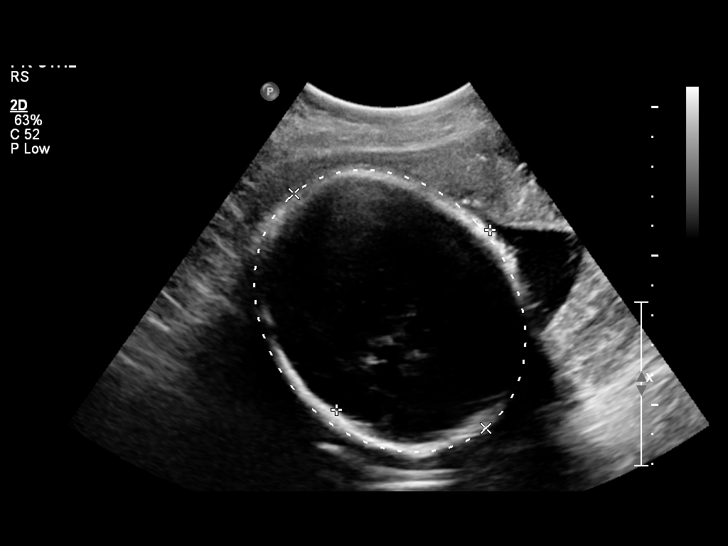
[im 31/33]
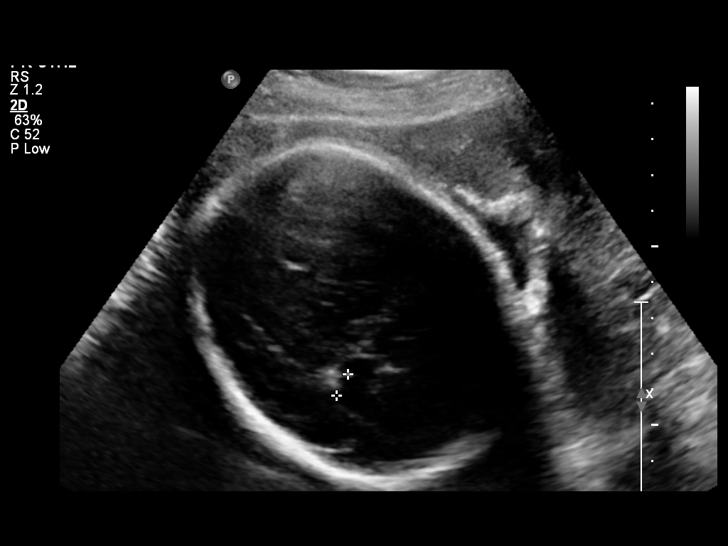

[12 of 28 positions shown; findings below may reference images not displayed]

OBSTETRICS REPORT
                      (Signed Final 05/05/2013 [DATE])

Service(s) Provided

 US OB FOLLOW UP                                       76816.1
Indications

 No or Little Prenatal Care
 Advanced maternal age (AMA), Multigravida
 High risk pregnancy due to maternal drug abuse        648.40, 305.90,
 (Klonipin)
Fetal Evaluation

 Num Of Fetuses:    1
 Fetal Heart Rate:  149                          bpm
 Cardiac Activity:  Observed
 Presentation:      Cephalic
 Placenta:          Anterior, above cervical os
 P. Cord            Visualized, central
 Insertion:

 Amniotic Fluid
 AFI FV:      Subjectively within normal limits
 AFI Sum:     15.43   cm       55  %Tile     Larg Pckt:       6  cm
 RUQ:   6       cm   RLQ:    3.47   cm    LUQ:   3.82    cm   LLQ:    2.14   cm
Biometry

 BPD:     80.3  mm     G. Age:  32w 2d                CI:        74.06   70 - 86
                                                      FL/HC:      20.3   19.1 -

 HC:     296.3  mm     G. Age:  32w 5d       33  %    HC/AC:      1.01   0.96 -

 AC:     292.6  mm     G. Age:  33w 2d       82  %    FL/BPD:     74.8   71 - 87
 FL:      60.1  mm     G. Age:  31w 2d       20  %    FL/AC:      20.5   20 - 24

 Est. FW:    0552  gm      4 lb 6 oz     67  %
Gestational Age

 LMP:           33w 0d        Date:  09/16/12                 EDD:   06/23/13
 U/S Today:     32w 3d                                        EDD:   06/27/13
 Best:          32w 0d     Det. By:  U/S    (03/06/13)        EDD:   06/30/13
Anatomy

 Cranium:          Appears normal         Aortic Arch:      Previously seen
 Fetal Cavum:      Previously seen        Ductal Arch:      Not well visualized
 Ventricles:       Not well visualized    Diaphragm:        Appears normal
 Choroid Plexus:   Previously seen        Stomach:          Appears normal, left
                                                            sided
 Cerebellum:       Previously seen        Abdomen:          Appears normal
 Posterior Fossa:  Previously seen        Abdominal Wall:   Previously seen
 Nuchal Fold:      Not applicable (>20    Cord Vessels:     Previously seen
                   wks GA)
 Face:             Orbits and profile     Kidneys:          Not well visualized
                   previously seen
 Lips:             Previously seen        Bladder:          Appears normal
 Heart:            Appears normal         Spine:            Previously seen
                   (4CH, axis, and
                   situs)
 RVOT:             Appears normal         Lower             Previously seen
                                          Extremities:
 LVOT:             Appears normal         Upper             Previously seen
                                          Extremities:

 Other:  Female gender. Heels previously prev. visualized.
Targeted Anatomy

 Fetal Central Nervous System
 Lat. Ventricles:
Cervix Uterus Adnexa

 Cervix:       Not visualized (advanced GA >55wks)
Impression

 IUP at 32+0 weeks
 Normal but limited interval anatomy; anatomic survey
 complete previously
 Normal amniotic fluid volume
 Appropriate interval growth with EFW at the 67th %tile
Recommendations

 Follow-up as clinically indicated

## 2014-11-08 ENCOUNTER — Other Ambulatory Visit: Payer: Self-pay

## 2014-11-08 NOTE — Telephone Encounter (Signed)
Pharm requests RF of phenergan. Chelle do you want to RF? Pended.

## 2014-11-09 MED ORDER — PROMETHAZINE HCL 25 MG PO TABS: 25.0000 mg | ORAL_TABLET | Freq: Three times a day (TID) | ORAL | Status: DC | PRN

## 2014-11-10 ENCOUNTER — Encounter: Payer: Self-pay | Admitting: Physician Assistant

## 2014-11-29 ENCOUNTER — Encounter: Payer: Self-pay | Admitting: Physician Assistant

## 2014-11-29 ENCOUNTER — Ambulatory Visit (INDEPENDENT_AMBULATORY_CARE_PROVIDER_SITE_OTHER): Payer: Self-pay | Admitting: Physician Assistant

## 2014-11-29 VITALS — BP 124/98 | HR 87 | Temp 98.2°F | Resp 16 | Wt 234.0 lb

## 2014-11-29 DIAGNOSIS — M545 Low back pain: Secondary | ICD-10-CM

## 2014-11-29 DIAGNOSIS — F411 Generalized anxiety disorder: Secondary | ICD-10-CM

## 2014-11-29 MED ORDER — GABAPENTIN 300 MG PO CAPS: 900.0000 mg | ORAL_CAPSULE | Freq: Three times a day (TID) | ORAL | Status: DC

## 2014-11-29 MED ORDER — CLONAZEPAM 1 MG PO TABS: 1.0000 mg | ORAL_TABLET | Freq: Two times a day (BID) | ORAL | Status: DC | PRN

## 2014-11-29 NOTE — Progress Notes (Signed)
Patient ID: Daisy Tucker, female    DOB: 08-30-1975, 39 y.o.   MRN: [id number]  PCP: Alvie Fowles, PA-C  Subjective:   Chief Complaint  Patient presents with  . Medication Refill    clonazepam,promethazine,gabapentin,nystatin cream    HPI Presents for a medication refill.   Patient currently takes klonopin 2 mg daily for her anxiety; she reports that she likes taking 3 mg better but "Im ok with 2 mg". Denies any panic attacks since last visit. She expresses that her younger brother died a year ago last month so she has been grieving. Pt states when her anxiety occurs it is hard for patient to gauge what is "truly important" and she feels overwhelmed with the responsibilities of life. Pt reports overwhelming anxiety once a week. Husband works out of town, exacerbating the patients anxiety while taking care of her her children on her own during the week. Pt reports using self talk to calm herself during moments of anxiety.   Pt reports continued nerve pain despite 900 mg TID of gabapentin. She has an appointment with her neruosurgeon, Dr. Patrice Paradise, next week. The patient has Alex at home for further pain coverage but chooses not to use it due to its potential for addiction.  Review of Systems Constitutional: Positive for diaphoresis (palms sweat during times of anxiety.). Negative for appetite change and unexpected weight change.  Respiratory: Negative for chest tightness and shortness of breath.  Cardiovascular: Negative for chest pain and palpitations (no new onset palpitations, but does have palpitations during moments of anxiety, once weekly.).  Musculoskeletal: Positive for back pain (lumbar spondylosis with myelopathy). Negative for myalgias.  Neurological: Negative for dizziness, light-headedness and headaches.  Psychiatric/Behavioral: Negative for sleep disturbance. The patient is nervous/anxious.      Patient Active Problem List   Diagnosis Date Noted  . Tinea corporis  06/08/2013  . GERD (gastroesophageal reflux disease) 05/04/2013  . Edema 01/05/2013  . Lumbar spondylosis with myelopathy 09/29/2012  . GAD (generalized anxiety disorder) 02/04/2012     Prior to Admission medications   Medication Sig Start Date End Date Taking? Authorizing Provider  clonazePAM (KLONOPIN) 1 MG tablet Take 1 tablet (1 mg total) by mouth 2 (two) times daily as needed. for anxiety 11/29/14  Yes Quadre Bristol, PA-C  gabapentin (NEURONTIN) 300 MG capsule Take 3 capsules (900 mg total) by mouth 3 (three) times daily. 11/29/14  Yes Maizey Menendez, PA-C  ibuprofen (ADVIL,MOTRIN) 600 MG tablet Take 1 tablet (600 mg total) by mouth every 6 (six) hours. 07/03/13  Yes Keitha Butte, CNM  nystatin (MYCOSTATIN) powder APPLY  POWDER TOPICALLY TO AFFECTED AREA TWICE DAILY 06/10/14  Yes Curtiss Mahmood, PA-C  nystatin cream (MYCOSTATIN) Apply 1 application topically 2 (two) times daily as needed for dry skin.   Yes Historical Provider, MD  promethazine (PHENERGAN) 25 MG tablet Take 1 tablet (25 mg total) by mouth every 8 (eight) hours as needed for nausea. 11/09/14  Yes Gordon Vandunk, PA-C  HYDROcodone-acetaminophen (NORCO) 10-325 MG per tablet Take 0.5-1 tablets by mouth every 6 (six) hours as needed for severe pain. Patient not taking: Reported on 11/29/2014 09/11/14   Harrison Mons, PA-C     Allergies  Allergen Reactions  . Aspirin Other (See Comments)    Stomach intolerance per pt.  . Zofran Hives and Swelling    Swelling local at site of injection.       Objective:  Physical Exam  Constitutional: She is oriented to person, place, and time. She  appears well-developed and well-nourished. She is active and cooperative. No distress.  BP 124/98 mmHg  Pulse 87  Temp(Src) 98.2 F (36.8 C)  Resp 16  Wt 234 lb (106.142 kg)   Eyes: Conjunctivae are normal.  Neck: Neck supple. No thyromegaly present.  Cardiovascular: Normal rate, regular rhythm and normal heart sounds.     Pulmonary/Chest: Effort normal and breath sounds normal.  Lymphadenopathy:    She has no cervical adenopathy.  Neurological: She is alert and oriented to person, place, and time.  Skin: Skin is warm and dry.  Psychiatric: She has a normal mood and affect. Her speech is normal and behavior is normal.           Assessment & Plan:   1. GAD (generalized anxiety disorder) Stable/controlled on current regimen. - clonazePAM (KLONOPIN) 1 MG tablet; Take 1 tablet (1 mg total) by mouth 2 (two) times daily as needed. for anxiety  Dispense: 60 tablet; Refill: 0  2. Low back pain radiating to right leg Stable. Proceed per Dr. Towanda Malkin plans. - gabapentin (NEURONTIN) 300 MG capsule; Take 3 capsules (900 mg total) by mouth 3 (three) times daily.  Dispense: 270 capsule; Refill: 5  Return in about 6 months (around 06/01/2015).  Fara Chute, PA-C Physician Assistant-Certified [facility]

## 2014-11-29 NOTE — Patient Instructions (Signed)
Discuss the gabapentin dose with Dr. Patrice Paradise. There isn't good evidence supporting higher doses, but higher doses are sometimes used.

## 2014-11-29 NOTE — Progress Notes (Signed)
   Subjective:    Patient ID: Daisy Tucker, female    DOB: 04/16/1976, 39 y.o.   MRN: [id number]  HPI Pt is a pleasant white female here for a medication refill. Patient currently takes klonopin 2 mg daily for her anxiety; she reports that she likes taking 3 mg better but "Im ok with 2 mg". Denies any panic attacks since last visit. She expresses that her younger brother died a year ago last month so she has been grieving. Pt states when her anxiety occurs it is hard for patient to gauge what is "truly important" and she feels overwhelmed with the responsibilities of life. Pt reports overwhelming anxiety once a week. Husband works out of town, exacerbating the patients anxiety while taking care of her her children on her own during the week. Pt reports using self talk to calm herself during moments of anxiety. Pt reports continued nerve pain despite 900 mg TID of gabapentin. She has an appointment with her neruosurgeon, Dr. Patrice Paradise, next week. The patient has [place] at home for further pain coverage but chooses not to use it due to its potential for addiction.  Review of Systems  Constitutional: Positive for diaphoresis (palms sweat during times of anxiety.). Negative for appetite change and unexpected weight change.  Respiratory: Negative for chest tightness and shortness of breath.   Cardiovascular: Negative for chest pain and palpitations (no new onset palpitations, but does have palpitations during moments of anxiety, once weekly.).  Musculoskeletal: Positive for back pain (lumbar spondylosis with myelopathy). Negative for myalgias.  Neurological: Negative for dizziness, light-headedness and headaches.  Psychiatric/Behavioral: Negative for sleep disturbance. The patient is nervous/anxious.        Objective:   Physical Exam  Constitutional: She is oriented to person, place, and time. She appears well-developed and well-nourished. No distress.  BP 124/98 mmHg  Pulse 87  Temp(Src) 98.2 F  (36.8 C)  Resp 16  Wt 234 lb (106.142 kg)   HENT:  Head: Normocephalic and atraumatic.  Neck: Normal range of motion. Neck supple. No tracheal deviation present. No thyromegaly present.  Cardiovascular: Normal rate, regular rhythm and normal heart sounds.  Exam reveals no gallop and no friction rub.   No murmur heard. Pulmonary/Chest: Effort normal and breath sounds normal. No respiratory distress. She has no wheezes. She has no rales. She exhibits no tenderness.  Musculoskeletal: Normal range of motion.  Neurological: She is alert and oriented to person, place, and time.  Skin: Skin is warm and dry. No rash noted. She is not diaphoretic. No erythema. No pallor.  Psychiatric: She has a normal mood and affect. Her behavior is normal. Judgment and thought content normal.          Assessment & Plan:  1. GAD (generalized anxiety disorder) - clonazePAM (KLONOPIN) 1 MG tablet; Take 1 tablet (1 mg total) by mouth 2 (two) times daily as needed. for anxiety  Dispense: 60 tablet; Refill: 0  2. Low back pain radiating to right leg - gabapentin (NEURONTIN) 300 MG capsule; Take 3 capsules (900 mg total) by mouth 3 (three) times daily.  Dispense: 270 capsule; Refill: 5

## 2014-12-01 ENCOUNTER — Other Ambulatory Visit: Payer: Self-pay | Admitting: Physician Assistant

## 2014-12-03 ENCOUNTER — Telehealth: Payer: Self-pay | Admitting: Physician Assistant

## 2014-12-03 NOTE — Telephone Encounter (Signed)
Patient states that she needs a refill on Promethazine. The pharmacy sent a request on 12/01/2014 and that request was forwarded to [facility] on 12/03/2014. Please call patient when it is available for pickup at her pharmacy, [address]   [phone]

## 2014-12-04 ENCOUNTER — Other Ambulatory Visit: Payer: Self-pay | Admitting: Physician Assistant

## 2014-12-04 NOTE — Telephone Encounter (Signed)
Rx was sent in today. Left message on pt's voicemail.

## 2015-01-02 ENCOUNTER — Other Ambulatory Visit: Payer: Self-pay | Admitting: Physician Assistant

## 2015-01-03 NOTE — Telephone Encounter (Signed)
Rx faxed

## 2015-02-01 ENCOUNTER — Other Ambulatory Visit: Payer: Self-pay | Admitting: Physician Assistant

## 2015-02-04 NOTE — Telephone Encounter (Signed)
Meds ordered this encounter  Medications  . clonazePAM (KLONOPIN) 1 MG tablet    Sig: TAKE ONE TABLET BY MOUTH TWICE DAILY AS NEEDED FOR  ANXIETY    Dispense:  60 tablet    Refill:  0  . promethazine (PHENERGAN) 25 MG tablet    Sig: TAKE ONE TABLET BY MOUTH EVERY 8 HOURS AS NEEDED FOR NAUSEA    Dispense:  90 tablet    Refill:  0

## 2015-02-04 NOTE — Telephone Encounter (Signed)
Clonazepam faxed to the pharmacy.

## 2015-03-05 ENCOUNTER — Other Ambulatory Visit: Payer: Self-pay | Admitting: Physician Assistant

## 2015-03-06 NOTE — Telephone Encounter (Signed)
Meds ordered this encounter  Medications  . clonazePAM (KLONOPIN) 1 MG tablet    Sig: TAKE ONE TABLET BY MOUTH TWICE DAILY AS NEEDED FOR ANXIETY    Dispense:  60 tablet    Refill:  0  . promethazine (PHENERGAN) 25 MG tablet    Sig: TAKE ONE TABLET BY MOUTH EVERY 8 HOURS AS NEEDED FOR NAUSEA    Dispense:  90 tablet    Refill:  0

## 2015-03-07 NOTE — Telephone Encounter (Signed)
Faxed

## 2015-04-07 ENCOUNTER — Other Ambulatory Visit: Payer: Self-pay | Admitting: Physician Assistant

## 2015-04-09 NOTE — Telephone Encounter (Signed)
Rx printed at 104. Will bring to 102 after clinic.  Meds ordered this encounter  Medications  . promethazine (PHENERGAN) 25 MG tablet    Sig: TAKE ONE TABLET BY MOUTH EVERY 8 HOURS AS NEEDED FOR NAUSEA    Dispense:  90 tablet    Refill:  0  . clonazePAM (KLONOPIN) 1 MG tablet    Sig: TAKE ONE TABLET BY MOUTH TWICE DAILY AS NEEDED FOR ANXIETY    Dispense:  60 tablet    Refill:  0

## 2015-04-25 ENCOUNTER — Emergency Department (HOSPITAL_COMMUNITY)
Admission: EM | Admit: 2015-04-25 | Discharge: 2015-04-25 | Disposition: A | Discharge status: Home / Self Care | Payer: Medicaid Other | Attending: Physician Assistant | Admitting: Physician Assistant

## 2015-04-25 ENCOUNTER — Emergency Department (HOSPITAL_COMMUNITY): Payer: Medicaid Other

## 2015-04-25 ENCOUNTER — Encounter (HOSPITAL_COMMUNITY): Payer: Self-pay

## 2015-04-25 DIAGNOSIS — Z79899 Other long term (current) drug therapy: Secondary | ICD-10-CM | POA: Diagnosis not present

## 2015-04-25 DIAGNOSIS — Z791 Long term (current) use of non-steroidal anti-inflammatories (NSAID): Secondary | ICD-10-CM | POA: Insufficient documentation

## 2015-04-25 DIAGNOSIS — J189 Pneumonia, unspecified organism: Secondary | ICD-10-CM

## 2015-04-25 DIAGNOSIS — Z87891 Personal history of nicotine dependence: Secondary | ICD-10-CM | POA: Diagnosis not present

## 2015-04-25 DIAGNOSIS — J020 Streptococcal pharyngitis: Secondary | ICD-10-CM | POA: Insufficient documentation

## 2015-04-25 DIAGNOSIS — J159 Unspecified bacterial pneumonia: Secondary | ICD-10-CM | POA: Insufficient documentation

## 2015-04-25 DIAGNOSIS — R509 Fever, unspecified: Secondary | ICD-10-CM | POA: Diagnosis present

## 2015-04-25 DIAGNOSIS — Z8719 Personal history of other diseases of the digestive system: Secondary | ICD-10-CM | POA: Insufficient documentation

## 2015-04-25 DIAGNOSIS — F419 Anxiety disorder, unspecified: Secondary | ICD-10-CM | POA: Insufficient documentation

## 2015-04-25 LAB — RAPID STREP SCREEN (MED CTR MEBANE ONLY): Streptococcus, Group A Screen (Direct): POSITIVE — AB

## 2015-04-25 MED ORDER — PENICILLIN G BENZATHINE 1200000 UNIT/2ML IM SUSP
1.2000 10*6.[IU] | Freq: Once | INTRAMUSCULAR | Status: AC
  Administered 2015-04-25: 1.2 10*6.[IU] via INTRAMUSCULAR
  Filled 2015-04-25: qty 2

## 2015-04-25 MED ORDER — DOXYCYCLINE HYCLATE 100 MG PO CAPS: 100.0000 mg | ORAL_CAPSULE | Freq: Two times a day (BID) | ORAL | Status: DC

## 2015-04-25 MED ORDER — ACETAMINOPHEN-CODEINE 120-12 MG/5ML PO SOLN: 10.0000 mL | ORAL | Status: DC | PRN

## 2015-04-25 MED ORDER — GUAIFENESIN ER 1200 MG PO TB12: 1.0000 | ORAL_TABLET | Freq: Two times a day (BID) | ORAL | Status: DC

## 2015-04-25 MED ORDER — IBUPROFEN 800 MG PO TABS: 800.0000 mg | ORAL_TABLET | Freq: Three times a day (TID) | ORAL | Status: DC | PRN

## 2015-04-25 MED ORDER — SODIUM CHLORIDE 0.9 % IV BOLUS (SEPSIS): 1000.0000 mL | Freq: Once | INTRAVENOUS | Status: DC

## 2015-04-25 MED ORDER — DEXTROSE 5 % IV SOLN
1.0000 g | Freq: Once | INTRAVENOUS | Status: DC
  Filled 2015-04-25: qty 10

## 2015-04-25 MED ORDER — DOXYCYCLINE HYCLATE 100 MG PO TABS: 100.0000 mg | ORAL_TABLET | Freq: Once | ORAL | Status: DC

## 2015-04-25 NOTE — ED Notes (Signed)
Bed: WA02 Expected date:  Expected time:  Means of arrival:  Comments: 

## 2015-04-25 NOTE — Progress Notes (Signed)
Entered in d/c instructions Harrison Mons Go on 06/04/2015 Please go to or reschedule your appt with J Chelle on 06/04/15 at 1030 for your 6 month follow up appt [place] [id number] Medicaid [place] Access Covered Patient USE THIS WEBSITE TO ASSIST WITH UNDERSTANDING YOUR COVERAGE, [place] Medicaid Transportation to Dr appts if you are have full Medicaid: [phone], [phone] or [phone] Transportation Supervisor [phone] As a Medicaid client you MUST contact DSS/SSI each time you change address, move to another [place] or another state to keep your address updated [place]: [place], [place] [address] [email or web address] [facility] This is your assigned Medicaid [place] access doctor listed per our data base to be on your card If you prefer another contact DSS [phone] DSS assigned your doctor *You may receive a bill if you go to any family Dr not assigned to you [address] [place] [address] [phone]

## 2015-04-25 NOTE — Discharge Instructions (Signed)
Return here as needed.  Follow-up with your primary care doctor, increase your fluid intake, rest as much as possible °

## 2015-04-25 NOTE — ED Notes (Addendum)
Fever, cough/congestion x 1 week.  Family members sick also.  Sore throat.  Chest pain when coughing

## 2015-04-25 NOTE — ED Provider Notes (Signed)
CSN: [id number]     Arrival date & time 04/25/15  [id number] History   First MD Initiated Contact with Patient 04/25/15 1217     Chief Complaint  Patient presents with  . Cough  . Fever    HPI  Patient is a 39 y/o WF here with a CC of productive cough x1 week and fever.  Cough began last week and was productive of thick yellow sputum.  Friday of last week, she had acute onset of 103.6 fever with profuse diaphoresis.  She states she was unable to receive medical attention at that time because her husband had just been discharged s/p appendectomy and has a 2 y/o child.  Her fever broke with "home remedies".  After fever broke, her cough became dry, hacking, and productive of bright red blood. The thick yellow sputum returned today.  Denies CP, SOB, hx asthma, hx COPD.  She vaporizes tobacco but has not done so for 2 weeks due to symptoms.  Her husband was diagnosed and treated for strep a couple of days ago; she is requesting strep testing since her throat is beginning to feel sore.     Past Medical History  Diagnosis Date  . Anxiety   . Miscarriage     Rh negative  . Nausea   . GERD (gastroesophageal reflux disease)   . PNA (pneumonia)   . Vaginal Pap smear, abnormal     f/u was ok  . Drug use complicating pregnancy [id number]    05/04/13 drug screen showed negative for benzos - DO NOT GIVE REFILL ON PRESCRIPTION FOR BENZOS!!!!! Positive for prescribed Klonopin.  Also positive for Suboxone/Subutex and unknown opiate.  Needs drug screening again in third trimester.     . Rh negative status during pregnancy in second trimester, antepartum 03/31/2013  . Supervision of high-risk pregnancy 03/01/2013    05/04/13 drug screen showed negative for benzos - DO NOT GIVE REFILL ON PRESCRIPTION FOR BENZOS!!!!!  Clinic [facility]  Dating  Currently based on LMP.    Genetic Screen Panorama requested--drawn 10/30--WNL  Anatomic US wnl--limited views of heart--repeat in 4 weeks--ordered--WNL  GTT   Third trimester: 1  hour 107  TDaP vaccine declines  Flu vaccine declines  GBS neg  Baby Food  breast   Contraception     Past Surgical History  Procedure Laterality Date  . Spine surgery  2008    L4-5 disc reconstruction   Family History  Problem Relation Age of Onset  . Stroke Father     x 3  . Alcohol abuse Mother   . Cancer Maternal Grandmother     lung  . Stroke Paternal Grandmother   . Hearing loss Neg Hx   . Alcohol abuse Brother   . Drug abuse Brother     IVD   Social History  Substance Use Topics  . Smoking status: Former Smoker    Types: Cigarettes    Quit date: 03/28/2013  . Smokeless tobacco: Never Used     Comment: patient is on electronic cigarette ; she is trying to quit.  . Alcohol Use: No   OB History    Gravida Para Term Preterm AB TAB SAB Ectopic Multiple Living   3 2 2  0 1 1 0 0 0 2     Review of Systems All other systems negative except as documented in the HPI. All pertinent positives and negatives as reviewed in the HPI.    Allergies  Aspirin and Zofran  Home Medications  Prior to Admission medications   Medication Sig Start Date End Date Taking? Authorizing Provider  clonazePAM (KLONOPIN) 1 MG tablet TAKE ONE TABLET BY MOUTH TWICE DAILY AS NEEDED FOR ANXIETY 04/09/15   Chelle Jeffery, PA-C  gabapentin (NEURONTIN) 300 MG capsule Take 3 capsules (900 mg total) by mouth 3 (three) times daily. 11/29/14   Chelle Jeffery, PA-C  HYDROcodone-acetaminophen (NORCO) 10-325 MG per tablet Take 0.5-1 tablets by mouth every 6 (six) hours as needed for severe pain. Patient not taking: Reported on 11/29/2014 09/11/14   Harrison Mons, PA-C  ibuprofen (ADVIL,MOTRIN) 600 MG tablet Take 1 tablet (600 mg total) by mouth every 6 (six) hours. 07/03/13   Keitha Butte, CNM  nystatin (MYCOSTATIN) powder APPLY  POWDER TOPICALLY TO AFFECTED AREA TWICE DAILY 06/10/14   Chelle Jeffery, PA-C  nystatin cream (MYCOSTATIN) Apply 1 application topically 2 (two) times daily as needed for dry skin.     Historical Provider, MD  promethazine (PHENERGAN) 25 MG tablet TAKE ONE TABLET BY MOUTH EVERY 8 HOURS AS NEEDED FOR NAUSEA 04/09/15   Chelle Jeffery, PA-C   BP 130/87 mmHg  Pulse 111  Temp(Src) 98.1 F (36.7 C) (Oral)  Resp 20  SpO2 98%  LMP 04/18/2015   Physical Exam  Constitutional: She is oriented to person, place, and time. She appears well-developed and well-nourished. No distress.  HENT:  Head: Normocephalic and atraumatic.  Nose: Right sinus exhibits no maxillary sinus tenderness and no frontal sinus tenderness. Left sinus exhibits no maxillary sinus tenderness and no frontal sinus tenderness.  Mouth/Throat: Mucous membranes are normal. Posterior oropharyngeal erythema present. No oropharyngeal exudate.  Eyes: Pupils are equal, round, and reactive to light.  Neck: Normal range of motion. Neck supple.  Cardiovascular: Normal rate, regular rhythm and normal heart sounds.   Pulmonary/Chest: Effort normal and breath sounds normal. No accessory muscle usage. No tachypnea. No respiratory distress. She has no wheezes. She has no rhonchi. She has no rales.  Neurological: She is alert and oriented to person, place, and time. She exhibits normal muscle tone. Coordination normal.  Skin: Skin is warm and dry.  Psychiatric: She has a normal mood and affect. Her behavior is normal.  Nursing note and vitals reviewed.     ED Course  Procedures (including critical care time) Labs Review Labs Reviewed  RAPID STREP SCREEN (NOT AT [facility])    Imaging Review Dg Chest 2 View  04/25/2015  CLINICAL DATA:  Fever, cough, congestion for 1 week EXAM: CHEST  2 VIEW COMPARISON:  03/20/2013 FINDINGS: Cardiomediastinal silhouette is stable. Mild perihilar bronchitic changes. There is subtle patchy atelectasis or early infiltrate right upper lobe perihilar. Bony thorax is stable. IMPRESSION: Mild perihilar bronchitic changes. Subtle mild atelectasis or early infiltrate in right upper lobe perihilar region.  Electronically Signed   By: Lahoma Crocker M.D.   On: 04/25/2015 10:44   I have personally reviewed and evaluated these images and lab results as part of my medical decision-making.   EKG Interpretation None      Assessment & Plan  CXR suggestive of early infiltrate in right upper lobe perihilar region, will treat with doxycycline regimen   Rapid strep pending     Patient be treated for pneumonia and strep pharyngitis.  Told to return here as needed.  Patient agrees the plan and all questions were answered.  Patient is stable and advised of the plan  Dalia Heading, PA-C 04/25/15 1613  Courteney Lyn Mackuen, MD 04/26/15 1558

## 2015-05-03 ENCOUNTER — Other Ambulatory Visit: Payer: Self-pay | Admitting: Physician Assistant

## 2015-05-06 ENCOUNTER — Other Ambulatory Visit: Payer: Self-pay | Admitting: Physician Assistant

## 2015-05-06 NOTE — Telephone Encounter (Signed)
Faxed

## 2015-05-06 NOTE — Telephone Encounter (Signed)
Meds ordered this encounter  Medications  . clonazePAM (KLONOPIN) 1 MG tablet    Sig: TAKE ONE TABLET BY MOUTH TWICE DAILY AS NEEDED FOR ANXIETY    Dispense:  60 tablet    Refill:  0  . promethazine (PHENERGAN) 25 MG tablet    Sig: TAKE ONE TABLET BY MOUTH EVERY 8 HOURS AS NEEDED FOR NAUSEA    Dispense:  90 tablet    Refill:  0    

## 2015-05-30 ENCOUNTER — Ambulatory Visit: Payer: Self-pay | Admitting: Physician Assistant

## 2015-06-04 ENCOUNTER — Ambulatory Visit: Payer: Self-pay | Admitting: Physician Assistant

## 2015-06-15 ENCOUNTER — Ambulatory Visit (INDEPENDENT_AMBULATORY_CARE_PROVIDER_SITE_OTHER): Payer: Self-pay | Admitting: Physician Assistant

## 2015-06-15 VITALS — BP 130/84 | HR 91 | Temp 97.5°F | Resp 16 | Ht 69.0 in | Wt 213.0 lb

## 2015-06-15 DIAGNOSIS — M545 Low back pain, unspecified: Secondary | ICD-10-CM

## 2015-06-15 DIAGNOSIS — F411 Generalized anxiety disorder: Secondary | ICD-10-CM

## 2015-06-15 DIAGNOSIS — K219 Gastro-esophageal reflux disease without esophagitis: Secondary | ICD-10-CM

## 2015-06-15 DIAGNOSIS — M4716 Other spondylosis with myelopathy, lumbar region: Secondary | ICD-10-CM

## 2015-06-15 DIAGNOSIS — M79604 Pain in right leg: Secondary | ICD-10-CM

## 2015-06-15 DIAGNOSIS — B354 Tinea corporis: Secondary | ICD-10-CM

## 2015-06-15 MED ORDER — CLONAZEPAM 1 MG PO TABS: 1.0000 mg | ORAL_TABLET | Freq: Two times a day (BID) | ORAL | Status: DC | PRN

## 2015-06-15 MED ORDER — PROMETHAZINE HCL 25 MG PO TABS: 25.0000 mg | ORAL_TABLET | Freq: Three times a day (TID) | ORAL | Status: DC | PRN

## 2015-06-15 MED ORDER — GABAPENTIN 300 MG PO CAPS: 900.0000 mg | ORAL_CAPSULE | Freq: Three times a day (TID) | ORAL | Status: DC

## 2015-06-15 MED ORDER — NYSTATIN 100000 UNIT/GM EX POWD: CUTANEOUS | Status: DC

## 2015-06-15 NOTE — Progress Notes (Signed)
Patient ID: Daisy Tucker, female    DOB: Mar 11, 1976, 40 y.o.   MRN: [id number]  PCP: Rama Mcclintock, PA-C  Subjective:   Chief Complaint  Patient presents with  . Follow-up    Rochelle Nephew  . Medication Refill    HPI Presents for a medication refill of Clonazepam, Gabapentin, Phenergan, and Nystatin (powder).   Clonazepam she is trying to taking as instructed. She admits over the last month she has stretched it to only needing one medication on some day. She says she needs it more around bed time and is able to resist the urge during the day more so. Attributes the success of decreasing her medication to connecting with and talking with good people at church, and also going outside with her 39 year old which takes her mind off the anxiety.   LMP was 05/15/15, does not use protection. Says she is on schedule to get period. Mentioned if she didn't have this back problem she would desire having another kid. However, she doesn't think she could tolerate pregnancy with her current back issue. She does have Medicaid now, but our office does not participate. She has never seen the provider designated her PCP on her card. As such, we are not able to refer her for speciality care.   She has been taking the Gabapentin as instructed. Taking Phenergan prn as instructed. Nystatin (powder) taking as instructed. No complaints with any of the medications.     Review of Systems Constitutional: Positive for fatigue.  Respiratory: Negative for chest tightness and shortness of breath.  Cardiovascular: Negative for chest pain and palpitations.  Musculoskeletal: Positive for back pain.     Patient Active Problem List   Diagnosis Date Noted  . Tinea corporis 06/08/2013  . GERD (gastroesophageal reflux disease) 05/04/2013  . Edema 01/05/2013  . Lumbar spondylosis with myelopathy 09/29/2012  . GAD (generalized anxiety disorder) 02/04/2012     Prior to Admission medications   Medication Sig  Start Date End Date Taking? Authorizing Provider  clonazePAM (KLONOPIN) 1 MG tablet TAKE ONE TABLET BY MOUTH TWICE DAILY AS NEEDED FOR ANXIETY 05/06/15  Yes Briceyda Abdullah, PA-C  gabapentin (NEURONTIN) 300 MG capsule Take 3 capsules (900 mg total) by mouth 3 (three) times daily. 11/29/14  Yes Shemeka Wardle, PA-C  Guaifenesin 1200 MG TB12 Take 1 tablet (1,200 mg total) by mouth 2 (two) times daily. 04/25/15  Yes Dalia Heading, PA-C  HYDROcodone-acetaminophen (NORCO) 10-325 MG per tablet Take 0.5-1 tablets by mouth every 6 (six) hours as needed for severe pain. 09/11/14  Yes Saahil Herbster, PA-C  ibuprofen (ADVIL,MOTRIN) 800 MG tablet Take 1 tablet (800 mg total) by mouth every 8 (eight) hours as needed. 04/25/15  Yes Christopher Lawyer, PA-C  nystatin (MYCOSTATIN) powder APPLY  POWDER TOPICALLY TO AFFECTED AREA TWICE DAILY 06/10/14  Yes Vail Vuncannon, PA-C  promethazine (PHENERGAN) 25 MG tablet TAKE ONE TABLET BY MOUTH EVERY 8 HOURS AS NEEDED FOR NAUSEA 05/06/15  Yes Tallis Soledad, PA-C     Allergies  Allergen Reactions  . Aspirin Other (See Comments)    Stomach intolerance per pt.  . Zofran Hives and Swelling    Swelling local at site of injection.       Objective:  Physical Exam  Constitutional: She is oriented to person, place, and time. Vital signs are normal. She appears well-developed and well-nourished. She is active and cooperative. No distress.  BP 130/84 mmHg  Pulse 91  Temp(Src) 97.5 F (36.4 C) (Oral)  Resp 16  Ht 5\' 9"  (1.753 m)  Wt 213 lb (96.616 kg)  BMI 31.44 kg/m2  SpO2 98%  LMP 05/15/2015  HENT:  Head: Normocephalic and atraumatic.  Right Ear: Hearing normal.  Left Ear: Hearing normal.  Eyes: Conjunctivae are normal. No scleral icterus.  Neck: Normal range of motion. Neck supple. No thyromegaly present.  Cardiovascular: Normal rate, regular rhythm and normal heart sounds.   Pulses:      Radial pulses are 2+ on the right side, and 2+ on the left side.    Pulmonary/Chest: Effort normal and breath sounds normal.  Musculoskeletal:       Cervical back: Normal.       Thoracic back: Normal.       Lumbar back: She exhibits decreased range of motion, tenderness, bony tenderness and pain. She exhibits no swelling, no edema, no deformity, no laceration, no spasm and normal pulse.  Lymphadenopathy:       Head (right side): No tonsillar, no preauricular, no posterior auricular and no occipital adenopathy present.       Head (left side): No tonsillar, no preauricular, no posterior auricular and no occipital adenopathy present.    She has no cervical adenopathy.       Right: No supraclavicular adenopathy present.       Left: No supraclavicular adenopathy present.  Neurological: She is alert and oriented to person, place, and time. No sensory deficit.  Skin: Skin is warm, dry and intact. No rash noted. No cyanosis or erythema. Nails show no clubbing.  Psychiatric: She has a normal mood and affect.           Assessment & Plan:   1. Low back pain radiating to right leg 3. Lumbar spondylosis with myelopathy Continue current treatment. Encouraged her to schedule with the PCP indicated on her medicaid card. That person will need to refer her for speciality care.  - gabapentin (NEURONTIN) 300 MG capsule; Take 3 capsules (900 mg total) by mouth 3 (three) times daily.  Dispense: 270 capsule; Refill: 5  2. Tinea corporis Stable. - nystatin (MYCOSTATIN) powder; APPLY  POWDER TOPICALLY TO AFFECTED AREA TWICE DAILY  Dispense: 60 g; Refill: 5  4. GAD (generalized anxiety disorder) Continue efforts to reduce stress and clonazepam use with non-pharmaceutical activities.  - clonazePAM (KLONOPIN) 1 MG tablet; Take 1 tablet (1 mg total) by mouth 2 (two) times daily as needed. for anxiety  Dispense: 60 tablet; Refill: 0  5. Gastroesophageal reflux disease without esophagitis Stable. - promethazine (PHENERGAN) 25 MG tablet; Take 1 tablet (25 mg total) by mouth  every 8 (eight) hours as needed. for nausea  Dispense: 90 tablet; Refill: 3   Fara Chute, PA-C Physician Assistant-Certified [facility]

## 2015-06-15 NOTE — Patient Instructions (Signed)
If you do not want to be pregnant now, please actively prevent it. Use condoms EVERY SINGLE TIME. Consider a Paragard (copper) IUD. GYN will place this.

## 2015-06-15 NOTE — Progress Notes (Signed)
Subjective:    Patient ID: Daisy Tucker, female    DOB: 06-27-1975, 40 y.o.   MRN: [id number]  Chief Complaint  Patient presents with  . Follow-up    chelle  . Medication Refill    HPI Patient presents today for a medication refill of Clonazepam, Gabapentin, Phenergan, and Nystatin (powder).   Clonazepam she is trying to taking as instructed. She admits over the last month she has stretched it to only needing one medication on some day. She says she needs it more around bed time and is able to resist the urge during the day more so. Attributes the success of decreasing her medication to connecting with and talking with good people at church, and also  going outside with her 39 year old which takes her mind off the anxiety. LMP was 05/15/15, does not use protection. Says she is on schedule to get period. Mentioned if she didn't have this back problem she would desire having another kid.   She has been taking the Gabapentin as instructed. Taking Phenergan prn as instructed. Nystatin (powder) taking as instructed. No complaints with any of the medications.    Review of Systems  Constitutional: Positive for fatigue.  Respiratory: Negative for chest tightness and shortness of breath.   Cardiovascular: Negative for chest pain and palpitations.  Musculoskeletal: Positive for back pain.   Allergies  Allergen Reactions  . Aspirin Other (See Comments)    Stomach intolerance per pt.  . Zofran Hives and Swelling    Swelling local at site of injection.   Prior to Admission medications   Medication Sig Start Date End Date Taking? Authorizing Provider  clonazePAM (KLONOPIN) 1 MG tablet Take 1 tablet (1 mg total) by mouth 2 (two) times daily as needed. for anxiety 06/15/15  Yes Chelle Jeffery, PA-C  gabapentin (NEURONTIN) 300 MG capsule Take 3 capsules (900 mg total) by mouth 3 (three) times daily. 06/15/15  Yes Chelle Jeffery, PA-C  Guaifenesin 1200 MG TB12 Take 1 tablet (1,200 mg total) by  mouth 2 (two) times daily. 04/25/15  Yes Dalia Heading, PA-C  HYDROcodone-acetaminophen (NORCO) 10-325 MG per tablet Take 0.5-1 tablets by mouth every 6 (six) hours as needed for severe pain. 09/11/14  Yes Chelle Jeffery, PA-C  ibuprofen (ADVIL,MOTRIN) 800 MG tablet Take 1 tablet (800 mg total) by mouth every 8 (eight) hours as needed. 04/25/15  Yes Christopher Lawyer, PA-C  nystatin (MYCOSTATIN) powder APPLY  POWDER TOPICALLY TO AFFECTED AREA TWICE DAILY 06/15/15  Yes Chelle Jeffery, PA-C  promethazine (PHENERGAN) 25 MG tablet Take 1 tablet (25 mg total) by mouth every 8 (eight) hours as needed. for nausea 06/15/15  Yes Harrison Mons, PA-C   Patient Active Problem List   Diagnosis Date Noted  . Tinea corporis 06/08/2013  . GERD (gastroesophageal reflux disease) 05/04/2013  . Edema 01/05/2013  . Lumbar spondylosis with myelopathy 09/29/2012  . GAD (generalized anxiety disorder) 02/04/2012       Objective:   Physical Exam  Constitutional: She appears well-developed and well-nourished.  BP 130/84 mmHg  Pulse 91  Temp(Src) 97.5 F (36.4 C) (Oral)  Resp 16  Ht 5\' 9"  (1.753 m)  Wt 213 lb (96.616 kg)  BMI 31.44 kg/m2  SpO2 98%  LMP 05/15/2015   HENT:  Head: Normocephalic and atraumatic.  Right Ear: External ear normal.  Left Ear: External ear normal.  Nose: Nose normal.  Eyes: Conjunctivae and lids are normal. Pupils are equal, round, and reactive to light.  Neck: Trachea  normal. Neck supple. No JVD present.  Cardiovascular: Normal rate, regular rhythm, S1 normal, S2 normal and normal heart sounds.  Exam reveals no gallop and no friction rub.   No murmur heard. Pulmonary/Chest: Effort normal and breath sounds normal.  Lymphadenopathy:    She has no cervical adenopathy.  Neurological: She is alert. She has normal reflexes.  Skin: Skin is warm and dry.  Psychiatric: She has a normal mood and affect. Her behavior is normal.  Vitals reviewed.      Assessment & Plan:  1. Low  back pain radiating to right leg 2. Lumbar spondylosis with myelopathy - gabapentin (NEURONTIN) 300 MG capsule; Take 3 capsules (900 mg total) by mouth 3 (three) times daily.  Dispense: 270 capsule; Refill: 5  3. Tinea corporis - nystatin (MYCOSTATIN) powder; APPLY  POWDER TOPICALLY TO AFFECTED AREA TWICE DAILY  Dispense: 60 g; Refill: 5  4. GAD (generalized anxiety disorder) - clonazePAM (KLONOPIN) 1 MG tablet; Take 1 tablet (1 mg total) by mouth 2 (two) times daily as needed. for anxiety  Dispense: 60 tablet; Refill: 0  5. Gastroesophageal reflux disease without esophagitis - promethazine (PHENERGAN) 25 MG tablet; Take 1 tablet (25 mg total) by mouth every 8 (eight) hours as needed. for nausea  Dispense: 90 tablet; Refill: 3  Discussed with patient that we would like her to proactively be preventing pregnancy if she does not wish to at this time and is sexually active on a Benzo.  Return in about 4 months (around 10/13/2015).

## 2015-07-14 ENCOUNTER — Other Ambulatory Visit: Payer: Self-pay | Admitting: Physician Assistant

## 2015-07-16 NOTE — Telephone Encounter (Signed)
Rx printed at 104. Will bring to 102 after clinic.  Meds ordered this encounter  Medications  . clonazePAM (KLONOPIN) 1 MG tablet    Sig: TAKE ONE TABLET BY MOUTH TWICE DAILY AS NEEDED FOR ANXIETY    Dispense:  60 tablet    Refill:  0

## 2015-07-18 NOTE — Telephone Encounter (Signed)
Rx faxed

## 2015-08-19 ENCOUNTER — Other Ambulatory Visit: Payer: Self-pay | Admitting: Physician Assistant

## 2015-08-22 NOTE — Telephone Encounter (Signed)
Meds ordered this encounter  Medications  . clonazePAM (KLONOPIN) 1 MG tablet    Sig: TAKE ONE TABLET BY MOUTH TWICE DAILY AS NEEDED FOR ANXIETY    Dispense:  60 tablet    Refill:  0

## 2015-08-25 NOTE — Telephone Encounter (Signed)
Patient would like her prescription faxed since that's what we usually do. [facility] on [address]

## 2015-08-26 NOTE — Telephone Encounter (Signed)
Faxed

## 2015-09-09 ENCOUNTER — Ambulatory Visit (INDEPENDENT_AMBULATORY_CARE_PROVIDER_SITE_OTHER): Payer: Self-pay | Admitting: Family Medicine

## 2015-09-09 VITALS — BP 114/72 | HR 102 | Temp 98.5°F | Resp 18 | Ht 69.0 in | Wt 205.0 lb

## 2015-09-09 DIAGNOSIS — R062 Wheezing: Secondary | ICD-10-CM

## 2015-09-09 DIAGNOSIS — J069 Acute upper respiratory infection, unspecified: Secondary | ICD-10-CM

## 2015-09-09 DIAGNOSIS — R059 Cough, unspecified: Secondary | ICD-10-CM

## 2015-09-09 DIAGNOSIS — R05 Cough: Secondary | ICD-10-CM

## 2015-09-09 MED ORDER — AZITHROMYCIN 250 MG PO TABS: ORAL_TABLET | ORAL | Status: DC

## 2015-09-09 MED ORDER — ALBUTEROL SULFATE HFA 108 (90 BASE) MCG/ACT IN AERS: 2.0000 | INHALATION_SPRAY | RESPIRATORY_TRACT | Status: DC | PRN

## 2015-09-09 MED ORDER — BENZONATATE 100 MG PO CAPS: 100.0000 mg | ORAL_CAPSULE | Freq: Three times a day (TID) | ORAL | Status: DC | PRN

## 2015-09-09 NOTE — Patient Instructions (Addendum)
IF you received an x-ray today, you will receive an invoice from [facility]. Please contact [facility] at [phone] with questions or concerns regarding your invoice.   IF you received labwork today, you will receive an invoice from [facility]. Please contact [facility] at [phone] with questions or concerns regarding your invoice.   Our billing staff will not be able to assist you with questions regarding bills from these companies.  You will be contacted with the lab results as soon as they are available. The fastest way to get your results is to activate your My Chart account. Instructions are located on the last page of this paperwork. If you have not heard from us regarding the results in 2 weeks, please contact this office.   Upper Respiratory Infection, Adult Most upper respiratory infections (URIs) are a viral infection of the air passages leading to the lungs. A URI affects the nose, throat, and upper air passages. The most common type of URI is nasopharyngitis and is typically referred to as "the common cold." URIs run their course and usually go away on their own. Most of the time, a URI does not require medical attention, but sometimes a bacterial infection in the upper airways can follow a viral infection. This is called a secondary infection. Sinus and middle ear infections are common types of secondary upper respiratory infections. Bacterial pneumonia can also complicate a URI. A URI can worsen asthma and chronic obstructive pulmonary disease (COPD). Sometimes, these complications can require emergency medical care and may be life threatening.  CAUSES Almost all URIs are caused by viruses. A virus is a type of germ and can spread from one person to another.  RISKS FACTORS You may be at risk for a URI if:   You smoke.   You have chronic heart or lung disease.  You have a weakened defense (immune) system.   You are very young  or very old.   You have nasal allergies or asthma.  You work in crowded or poorly ventilated areas.  You work in health care facilities or schools. SIGNS AND SYMPTOMS  Symptoms typically develop 2-3 days after you come in contact with a cold virus. Most viral URIs last 7-10 days. However, viral URIs from the influenza virus (flu virus) can last 14-18 days and are typically more severe. Symptoms may include:   Runny or stuffy (congested) nose.   Sneezing.   Cough.   Sore throat.   Headache.   Fatigue.   Fever.   Loss of appetite.   Pain in your forehead, behind your eyes, and over your cheekbones (sinus pain).  Muscle aches.  DIAGNOSIS  Your health care provider may diagnose a URI by:  Physical exam.  Tests to check that your symptoms are not due to another condition such as:  Strep throat.  Sinusitis.  Pneumonia.  Asthma. TREATMENT  A URI goes away on its own with time. It cannot be cured with medicines, but medicines may be prescribed or recommended to relieve symptoms. Medicines may help:  Reduce your fever.  Reduce your cough.  Relieve nasal congestion. HOME CARE INSTRUCTIONS   Take medicines only as directed by your health care provider.   Gargle warm saltwater or take cough drops to comfort your throat as directed by your health care provider.  Use a warm mist humidifier or inhale steam from a shower to increase air moisture. This may make it easier to breathe.  Drink enough fluid to keep your   urine clear or pale yellow.   Eat soups and other clear broths and maintain good nutrition.   Rest as needed.   Return to work when your temperature has returned to normal or as your health care provider advises. You may need to stay home longer to avoid infecting others. You can also use a face mask and careful hand washing to prevent spread of the virus.  Increase the usage of your inhaler if you have asthma.   Do not use any tobacco  products, including cigarettes, chewing tobacco, or electronic cigarettes. If you need help quitting, ask your health care provider. PREVENTION  The best way to protect yourself from getting a cold is to practice good hygiene.   Avoid oral or hand contact with people with cold symptoms.   Wash your hands often if contact occurs.  There is no clear evidence that vitamin C, vitamin E, echinacea, or exercise reduces the chance of developing a cold. However, it is always recommended to get plenty of rest, exercise, and practice good nutrition.  SEEK MEDICAL CARE IF:   You are getting worse rather than better.   Your symptoms are not controlled by medicine.   You have chills.  You have worsening shortness of breath.  You have brown or red mucus.  You have yellow or brown nasal discharge.  You have pain in your face, especially when you bend forward.  You have a fever.  You have swollen neck glands.  You have pain while swallowing.  You have white areas in the back of your throat. SEEK IMMEDIATE MEDICAL CARE IF:   You have severe or persistent:  Headache.  Ear pain.  Sinus pain.  Chest pain.  You have chronic lung disease and any of the following:  Wheezing.  Prolonged cough.  Coughing up blood.  A change in your usual mucus.  You have a stiff neck.  You have changes in your:  Vision.  Hearing.  Thinking.  Mood. MAKE SURE YOU:   Understand these instructions.  Will watch your condition.  Will get help right away if you are not doing well or get worse.   This information is not intended to replace advice given to you by your health care provider. Make sure you discuss any questions you have with your health care provider.   Document Released: 10/28/2000 Document Revised: 09/18/2014 Document Reviewed: 08/09/2013 Elsevier Interactive Patient Education 2016 Elsevier Inc.  

## 2015-09-09 NOTE — Progress Notes (Signed)
Subjective:    Patient ID: Daisy Tucker, female    DOB: [birth year], 40 y.o.   MRN: [id number]  HPI This is a 40 yo female who presents today with 3 days of runny nose, sore throat, ear pain. Has had worsening cough and chest congestion with thick yellow-green sputum. Felt hot but did not take temperature. Has been achy. Uses e cigarettes. Has had pneumonia twice in the last 2 years. Feels mildly short of breath. Has not had any medication for cough. Takes ibuprofen 800 mg TID chronically. Has heard some wheezes, has required albuterol inhaler in the past for cough/wheeze. Both of her children are sick.    Past Medical History  Diagnosis Date  . Anxiety   . Miscarriage     Rh negative  . Nausea   . GERD (gastroesophageal reflux disease)   . PNA (pneumonia)   . Vaginal Pap smear, abnormal     f/u was ok  . Drug use complicating pregnancy [id number]    05/04/13 drug screen showed negative for benzos - DO NOT GIVE REFILL ON PRESCRIPTION FOR BENZOS!!!!! Positive for prescribed Klonopin.  Also positive for Suboxone/Subutex and unknown opiate.  Needs drug screening again in third trimester.     . Rh negative status during pregnancy in second trimester, antepartum 03/31/2013  . Supervision of high-risk pregnancy 03/01/2013    05/04/13 drug screen showed negative for benzos - DO NOT GIVE REFILL ON PRESCRIPTION FOR BENZOS!!!!!  Clinic [facility]  Dating  Currently based on LMP.    Genetic Screen Panorama requested--drawn 10/30--WNL  Anatomic US wnl--limited views of heart--repeat in 4 weeks--ordered--WNL  GTT   Third trimester: 1 hour 107  TDaP vaccine declines  Flu vaccine declines  GBS neg  Baby Food  breast   Contraception     Past Surgical History  Procedure Laterality Date  . Spine surgery  2008    L4-5 disc reconstruction   Family History  Problem Relation Age of Onset  . Stroke Father     x 3  . Alcohol abuse Mother   . Cancer Maternal Grandmother     lung  . Stroke Paternal  Grandmother   . Hearing loss Neg Hx   . Alcohol abuse Brother   . Drug abuse Brother     IVD   Social History  Substance Use Topics  . Smoking status: Former Smoker    Types: Cigarettes    Quit date: 03/28/2013  . Smokeless tobacco: Never Used     Comment: patient is on electronic cigarette ; she is trying to quit.  . Alcohol Use: No    Review of Systems  Constitutional: Positive for fever, chills and fatigue.  HENT: Positive for ear pain, postnasal drip, rhinorrhea and sore throat.        Objective:   Physical Exam  Constitutional: She is oriented to person, place, and time. She appears well-developed and well-nourished. She appears ill. No distress.  HENT:  Head: Normocephalic and atraumatic.  Right Ear: Tympanic membrane, external ear and ear canal normal.  Left Ear: Tympanic membrane, external ear and ear canal normal.  Nose: Nose normal.  Mouth/Throat: Oropharynx is clear and moist. No oropharyngeal exudate.  Eyes: Conjunctivae are normal.  Neck: Normal range of motion. Neck supple.  Cardiovascular: Normal rate, regular rhythm and normal heart sounds.   Pulmonary/Chest: Effort normal. She has wheezes (few, scattered, expiratory).  Lymphadenopathy:    She has no cervical adenopathy.  Neurological: She is alert and oriented  to person, place, and time.  Skin: Skin is warm and dry. She is not diaphoretic.  Psychiatric: She has a normal mood and affect. Her behavior is normal. Judgment and thought content normal.  Vitals reviewed.     BP 114/72 mmHg  Pulse 102  Temp(Src) 98.5 F (36.9 C) (Oral)  Resp 18  Ht 5\' 9"  (1.753 m)  Wt 205 lb (92.987 kg)  BMI 30.26 kg/m2  SpO2 96%  LMP 08/09/2015 Wt Readings from Last 3 Encounters:  09/09/15 205 lb (92.987 kg)  06/15/15 213 lb (96.616 kg)  11/29/14 234 lb (106.142 kg)       Assessment & Plan:  1. Upper respiratory infection - could be viral, but with two episodes pneumonia in last year, will cover for bacterial  infection - azithromycin (ZITHROMAX) 250 MG tablet; Take 2 tablets on first day then 1 tablet daily until finished  Dispense: 6 tablet; Refill: 0 - albuterol (PROVENTIL HFA;VENTOLIN HFA) 108 (90 Base) MCG/ACT inhaler; Inhale 2 puffs into the lungs every 4 (four) hours as needed for wheezing or shortness of breath (cough, shortness of breath or wheezing.).  Dispense: 1 Inhaler; Refill: 1 - mucinex, increase fluids, RTC precautions reviewed  2. Cough - benzonatate (TESSALON) 100 MG capsule; Take 1-2 capsules (100-200 mg total) by mouth 3 (three) times daily as needed for cough.  Dispense: 40 capsule; Refill: 0 - albuterol (PROVENTIL HFA;VENTOLIN HFA) 108 (90 Base) MCG/ACT inhaler; Inhale 2 puffs into the lungs every 4 (four) hours as needed for wheezing or shortness of breath (cough, shortness of breath or wheezing.).  Dispense: 1 Inhaler; Refill: 1  3. Wheeze - albuterol (PROVENTIL HFA;VENTOLIN HFA) 108 (90 Base) MCG/ACT inhaler; Inhale 2 puffs into the lungs every 4 (four) hours as needed for wheezing or shortness of breath (cough, shortness of breath or wheezing.).  Dispense: 1 Inhaler; Refill: [place], FNP-BC  [facility]  09/09/2015 8:15 PM

## 2015-09-22 ENCOUNTER — Other Ambulatory Visit: Payer: Self-pay | Admitting: Physician Assistant

## 2015-09-24 NOTE — Telephone Encounter (Signed)
Rx printed at 104. Will bring to 102 after clinic.  Meds ordered this encounter  Medications  . clonazePAM (KLONOPIN) 1 MG tablet    Sig: TAKE ONE TABLET BY MOUTH TWICE DAILY AS NEEDED FOR ANXIETY    Dispense:  60 tablet    Refill:  0

## 2015-09-25 NOTE — Telephone Encounter (Signed)
Faxed

## 2015-11-07 ENCOUNTER — Other Ambulatory Visit: Payer: Self-pay | Admitting: Physician Assistant

## 2015-11-08 NOTE — Telephone Encounter (Signed)
Meds ordered this encounter  Medications  . promethazine (PHENERGAN) 25 MG tablet    Sig: TAKE ONE TABLET BY MOUTH EVERY 8 HOURS AS NEEDED FOR NAUSEA    Dispense:  90 tablet    Refill:  0  . clonazePAM (KLONOPIN) 1 MG tablet    Sig: TAKE ONE TABLET BY MOUTH TWICE DAILY AS NEEDED FOR ANXIETY    Dispense:  60 tablet    Refill:  0

## 2015-11-09 ENCOUNTER — Other Ambulatory Visit: Payer: Self-pay | Admitting: Physician Assistant

## 2015-12-16 ENCOUNTER — Other Ambulatory Visit: Payer: Self-pay | Admitting: Physician Assistant

## 2015-12-17 NOTE — Telephone Encounter (Signed)
Meds ordered this encounter  Medications  . promethazine (PHENERGAN) 25 MG tablet    Sig: TAKE ONE TABLET BY MOUTH EVERY 8 HOURS AS NEEDED FOR NAUSEA    Dispense:  90 tablet    Refill:  0  . clonazePAM (KLONOPIN) 1 MG tablet    Sig: TAKE ONE TABLET BY MOUTH TWICE DAILY AS NEEDED FOR ANXIETY    Dispense:  60 tablet    Refill:  0    Needs OV with me for additional fills this medication.

## 2015-12-18 NOTE — Telephone Encounter (Signed)
Faxed Rxs and notified pt of need for f/up and about same day appts. Transferred pt to try to set up trad appt or get Chelle's sch for same day appts.

## 2016-01-21 ENCOUNTER — Encounter: Payer: Self-pay | Admitting: Physician Assistant

## 2016-01-21 ENCOUNTER — Ambulatory Visit (INDEPENDENT_AMBULATORY_CARE_PROVIDER_SITE_OTHER): Payer: Self-pay | Admitting: Physician Assistant

## 2016-01-21 VITALS — BP 120/82 | HR 102 | Temp 98.0°F | Resp 18 | Ht 69.0 in | Wt 203.4 lb

## 2016-01-21 DIAGNOSIS — I868 Varicose veins of other specified sites: Secondary | ICD-10-CM

## 2016-01-21 DIAGNOSIS — R8290 Unspecified abnormal findings in urine: Secondary | ICD-10-CM

## 2016-01-21 DIAGNOSIS — L309 Dermatitis, unspecified: Secondary | ICD-10-CM

## 2016-01-21 DIAGNOSIS — I8390 Asymptomatic varicose veins of unspecified lower extremity: Secondary | ICD-10-CM

## 2016-01-21 DIAGNOSIS — F411 Generalized anxiety disorder: Secondary | ICD-10-CM

## 2016-01-21 DIAGNOSIS — M4716 Other spondylosis with myelopathy, lumbar region: Secondary | ICD-10-CM

## 2016-01-21 LAB — POC MICROSCOPIC URINALYSIS (UMFC): Mucus: ABSENT

## 2016-01-21 LAB — POCT URINALYSIS DIP (MANUAL ENTRY)
Bilirubin, UA: NEGATIVE
Blood, UA: NEGATIVE
Glucose, UA: NEGATIVE
Ketones, POC UA: NEGATIVE
Leukocytes, UA: NEGATIVE
Nitrite, UA: NEGATIVE
Protein Ur, POC: NEGATIVE
Spec Grav, UA: 1.01
Urobilinogen, UA: 0.2
pH, UA: 6.5

## 2016-01-21 MED ORDER — KETOCONAZOLE 2 % EX CREA: 1.0000 "application " | TOPICAL_CREAM | Freq: Every day | CUTANEOUS | 1 refills | Status: DC

## 2016-01-21 MED ORDER — CLONAZEPAM 1 MG PO TABS: 1.0000 mg | ORAL_TABLET | Freq: Two times a day (BID) | ORAL | 0 refills | Status: DC | PRN

## 2016-01-21 MED ORDER — GABAPENTIN 300 MG PO CAPS: 900.0000 mg | ORAL_CAPSULE | Freq: Three times a day (TID) | ORAL | 5 refills | Status: DC

## 2016-01-21 MED ORDER — PROMETHAZINE HCL 25 MG PO TABS: 25.0000 mg | ORAL_TABLET | Freq: Three times a day (TID) | ORAL | 0 refills | Status: DC | PRN

## 2016-01-21 NOTE — Progress Notes (Signed)
Patient ID: Daisy Tucker, female    DOB: 13-Jun-1975, 40 y.o.   MRN: [id number]  PCP: Harrison Mons, PA-C  Subjective:   Chief Complaint  Patient presents with  . Medication Refill    clonazepam, promethazine, and gabapentin (wants to discuss amount first)    HPI Presents for prescription refills, and evaluation of urine changes. In addition, she wants to discuss large veins on the abdomen since her second pregnancy.  She continues to desire lowering her use of clonazepam, but isn't ready to go lower again yet. Feels a little shaky and weird, having run out of clonazepam about 3 days ago.  Gabapentin helps her pain considerably, but it is very expensive now that she no longer has [employer] coverage, and she's trying to use the lowest dose possible. As such, she is tolerating more pain than previously. She expects that her husband's job will provide the opportunity for her family to obtain health insurance again in the next several months.  Urine is darker than usual. An ammonia odor that she has not previously had. Has stopped taking ibuprofen, so she has her regular back pain, and isn't able to distinguish her pain from possible pain in the kidneys. Some increased frequency, no urgency, no dysuria. No hematuria. No vaginal discharge. Some intermittent itching. Monogamous sex with her husband. Has recently been drinking less water and more sodas, coffee.   Second daughter's birth was traumatic due to nuchal cord. Has developed large bulging vessels in the low pelvis and mons. Occasionally tender. Is interested in knowing what options she has for addressing them, primarily from a cosmetic standpoint, though she is concerned that if they continue to get larger she may experience more pain.  Red rash on the feet began this summer. OTC athlete's foot medication makes it worse. Not itching. Brother has psoriasis. Has nystatin at home-prefers the cream to the powder most days-and it  seems to help.   Review of Systems As above.    Patient Active Problem List   Diagnosis Date Noted  . Tinea corporis 06/08/2013  . GERD (gastroesophageal reflux disease) 05/04/2013  . Edema 01/05/2013  . Lumbar spondylosis with myelopathy 09/29/2012  . GAD (generalized anxiety disorder) 02/04/2012     Prior to Admission medications   Medication Sig Start Date End Date Taking? Authorizing Provider  clonazePAM (KLONOPIN) 1 MG tablet TAKE ONE TABLET BY MOUTH TWICE DAILY AS NEEDED FOR ANXIETY 12/17/15  Yes Yadir Zentner, PA-C  gabapentin (NEURONTIN) 300 MG capsule Take 3 capsules (900 mg total) by mouth 3 (three) times daily. 06/15/15  Yes Camielle Sizer, PA-C  nystatin (MYCOSTATIN) powder APPLY  POWDER TOPICALLY TO AFFECTED AREA TWICE DAILY 06/15/15  Yes Destani Wamser, PA-C  promethazine (PHENERGAN) 25 MG tablet TAKE ONE TABLET BY MOUTH EVERY 8 HOURS AS NEEDED FOR NAUSEA 12/17/15  Yes Sorina Derrig, PA-C  albuterol (PROVENTIL HFA;VENTOLIN HFA) 108 (90 Base) MCG/ACT inhaler Inhale 2 puffs into the lungs every 4 (four) hours as needed for wheezing or shortness of breath (cough, shortness of breath or wheezing.). Patient not taking: Reported on 01/21/2016 09/09/15   Elby Beck, FNP  ibuprofen (ADVIL,MOTRIN) 800 MG tablet Take 1 tablet (800 mg total) by mouth every 8 (eight) hours as needed. Patient not taking: Reported on 01/21/2016 04/25/15   Dalia Heading, PA-C     Allergies  Allergen Reactions  . Aspirin Other (See Comments)    Stomach intolerance per pt.  . Zofran Hives and Swelling    Swelling  local at site of injection.       Objective:  Physical Exam  Constitutional: She is oriented to person, place, and time. She appears well-developed and well-nourished. She is active and cooperative. No distress.  BP 120/82   Pulse (!) 102   Temp 98 F (36.7 C) (Oral)   Resp 18   Ht 5\' 9"  (1.753 m)   Wt 203 lb 6.4 oz (92.3 kg)   LMP 12/15/2015   SpO2 98%   BMI 30.04 kg/m     HENT:  Head: Normocephalic and atraumatic.  Right Ear: Hearing normal.  Left Ear: Hearing normal.  Eyes: Conjunctivae are normal. No scleral icterus.  Neck: Normal range of motion. Neck supple. No thyromegaly present.  Cardiovascular: Normal rate, regular rhythm and normal heart sounds.   Pulses:      Radial pulses are 2+ on the right side, and 2+ on the left side.  Pulmonary/Chest: Effort normal and breath sounds normal.  Abdominal: Normal appearance and bowel sounds are normal.  Lower abdomen with redundant skin, small pannus. This skin is notable for large varicosities throughout.  Lymphadenopathy:       Head (right side): No tonsillar, no preauricular, no posterior auricular and no occipital adenopathy present.       Head (left side): No tonsillar, no preauricular, no posterior auricular and no occipital adenopathy present.    She has no cervical adenopathy.       Right: No supraclavicular adenopathy present.       Left: No supraclavicular adenopathy present.  Neurological: She is alert and oriented to person, place, and time. No sensory deficit.  Skin: Skin is warm, dry and intact. No rash noted. No cyanosis or erythema. Nails show no clubbing.  Psychiatric: She has a normal mood and affect. Her speech is normal and behavior is normal.       Results for orders placed or performed in visit on 01/21/16  POCT urinalysis dipstick  Result Value Ref Range   Color, UA yellow yellow   Clarity, UA clear clear   Glucose, UA negative negative   Bilirubin, UA negative negative   Ketones, POC UA negative negative   Spec Grav, UA 1.010    Blood, UA negative negative   pH, UA 6.5    Protein Ur, POC negative negative   Urobilinogen, UA 0.2    Nitrite, UA Negative Negative   Leukocytes, UA Negative Negative  POCT Microscopic Urinalysis ([facility])  Result Value Ref Range   WBC,UR,HPF,POC None None WBC/hpf   RBC,UR,HPF,POC None None RBC/hpf   Bacteria None None, Too numerous to count    Mucus Absent Absent   Epithelial Cells, UR Per Microscopy Few (A) None, Too numerous to count cells/hpf       Assessment & Plan:   1. Lumbar spondylosis with myelopathy Stable. She can reduce the gabapentin dose as she is able. Encouraged her to explore online coupons/discount cards and to check with several pharmacies, as the cost may vary. - gabapentin (NEURONTIN) 300 MG capsule; Take 3 capsules (900 mg total) by mouth 3 (three) times daily.  Dispense: 270 capsule; Refill: 5  2. GAD (generalized anxiety disorder) Stable. Continue to explore stress relieving activities that may allow a reduction in her use of clonazepam. I suspect that when she can afford the gabapentin more easily (once she has health insurance), she may find she does not need as much clonazepam. - clonazePAM (KLONOPIN) 1 MG tablet; Take 1 tablet (1 mg total) by mouth 2 (  two) times daily as needed. for anxiety  Dispense: 60 tablet; Refill: 0  3. Abnormal urine odor No evidence of infection. Encouraged increase water intake. - POCT urinalysis dipstick - POCT Microscopic Urinalysis ([facility])  4. Dermatitis Trial of ketoconazole cream. Once she is insured, would recommend dermatology evaluation. - ketoconazole (NIZORAL) 2 % cream; Apply 1 application topically daily.  Dispense: 60 g; Refill: 1  5. Varicose veins Again, lack of health insurance makes addressing this difficult. Once she has insurance, would explore options with OB or vein/vascular.   Fara Chute, PA-C Physician Assistant-Certified [facility]

## 2016-01-21 NOTE — Patient Instructions (Addendum)
www.goodrx.com  [facility] provides generic drugs at very low cost, and even delivers, no insurance needed. [address] [place], [place], [place] [address] [phone] or [phone] (phone) [phone] (fax) [email or web address]  Try some supportive undergarments (like Spanx) to help the varicose veins. Once you are insured, we can check with the OBGYN and sort out how to proceed.  Drink lots of water!     IF you received an x-ray today, you will receive an invoice from [facility]. Please contact [facility] at [phone] with questions or concerns regarding your invoice.   IF you received labwork today, you will receive an invoice from [facility]. Please contact [facility] at [phone] with questions or concerns regarding your invoice.   Our billing staff will not be able to assist you with questions regarding bills from these companies.  You will be contacted with the lab results as soon as they are available. The fastest way to get your results is to activate your My Chart account. Instructions are located on the last page of this paperwork. If you have not heard from [place] regarding the results in 2 weeks, please contact this office.

## 2016-02-18 ENCOUNTER — Other Ambulatory Visit: Payer: Self-pay | Admitting: Physician Assistant

## 2016-02-18 DIAGNOSIS — F411 Generalized anxiety disorder: Secondary | ICD-10-CM

## 2016-02-19 NOTE — Telephone Encounter (Signed)
Meds ordered this encounter  Medications  . clonazePAM (KLONOPIN) 1 MG tablet    Sig: TAKE ONE TABLET BY MOUTH TWICE DAILY AS NEEDED FOR  ANXIETY    Dispense:  60 tablet    Refill:  0  . promethazine (PHENERGAN) 25 MG tablet    Sig: TAKE ONE TABLET BY MOUTH EVERY 8 HOURS AS NEEDED FOR NAUSEA    Dispense:  90 tablet    Refill:  0    Please consider 90 day supplies to promote better adherence

## 2016-02-19 NOTE — Telephone Encounter (Signed)
phernergan e rx and clonazepam faxed

## 2016-03-19 ENCOUNTER — Other Ambulatory Visit: Payer: Self-pay | Admitting: Physician Assistant

## 2016-03-19 DIAGNOSIS — F411 Generalized anxiety disorder: Secondary | ICD-10-CM

## 2016-03-20 NOTE — Telephone Encounter (Signed)
Last seen for GAD 9/5, last RF 10/4 on both meds.

## 2016-03-21 NOTE — Telephone Encounter (Signed)
Last refills 02/19/16 01/21/16 last ov

## 2016-03-22 NOTE — Telephone Encounter (Signed)
RTC

## 2016-03-24 NOTE — Telephone Encounter (Signed)
Chelle,   Patient wants you to call her regarding her request for refills.    [phone]

## 2016-03-25 NOTE — Telephone Encounter (Addendum)
Called in clonazepam. LMOM that Chelle did send in her RFs and asked her to call back if she had any other questions for Chelle about the Rxs other than why they were not approved originally.

## 2016-03-25 NOTE — Telephone Encounter (Signed)
Is there an issue? Other than her request on 11/03 went to another provider who advised her she needed to RTC?  Meds ordered this encounter  Medications  . clonazePAM (KLONOPIN) 1 MG tablet    Sig: TAKE ONE TABLET BY MOUTH TWICE DAILY AS NEEDED FOR  ANXIETY    Dispense:  60 tablet    Refill:  0  . promethazine (PHENERGAN) 25 MG tablet    Sig: TAKE ONE TABLET BY MOUTH EVERY 8 HOURS AS NEEDED FOR NAUSEA    Dispense:  90 tablet    Refill:  0    Please consider 90 day supplies to promote better adherence

## 2016-04-20 ENCOUNTER — Other Ambulatory Visit: Payer: Self-pay | Admitting: Physician Assistant

## 2016-04-20 DIAGNOSIS — F411 Generalized anxiety disorder: Secondary | ICD-10-CM

## 2016-04-20 NOTE — Telephone Encounter (Signed)
01/2016 last ov 03/25/16 last refills

## 2016-04-22 NOTE — Telephone Encounter (Signed)
Meds ordered this encounter  Medications  . promethazine (PHENERGAN) 25 MG tablet    Sig: TAKE ONE TABLET BY MOUTH EVERY 8 HOURS AS NEEDED FOR  NAUSEA    Dispense:  90 tablet    Refill:  0    Please consider 90 day supplies to promote better adherence  . clonazePAM (KLONOPIN) 1 MG tablet    Sig: TAKE ONE TABLET BY MOUTH TWICE DAILY AS NEEDED FOR ANXIETY    Dispense:  60 tablet    Refill:  0

## 2016-04-23 NOTE — Telephone Encounter (Signed)
Called in.

## 2016-05-20 ENCOUNTER — Other Ambulatory Visit: Payer: Self-pay | Admitting: Physician Assistant

## 2016-05-20 DIAGNOSIS — F411 Generalized anxiety disorder: Secondary | ICD-10-CM

## 2016-05-21 NOTE — Telephone Encounter (Signed)
SSrefill req forPromethazine, clonazepam

## 2016-05-23 NOTE — Telephone Encounter (Signed)
Faxed to [facility] [place]

## 2016-05-23 NOTE — Telephone Encounter (Signed)
Meds ordered this encounter  Medications  . promethazine (PHENERGAN) 25 MG tablet    Sig: TAKE ONE TABLET BY MOUTH EVERY 8 HOURS AS NEEDED FOR NAUSEA    Dispense:  90 tablet    Refill:  0    Please consider 90 day supplies to promote better adherence  . clonazePAM (KLONOPIN) 1 MG tablet    Sig: TAKE ONE TABLET BY MOUTH TWICE DAILY AS NEEDED FOR ANXIETY    Dispense:  60 tablet    Refill:  0

## 2016-06-19 ENCOUNTER — Other Ambulatory Visit: Payer: Self-pay | Admitting: Physician Assistant

## 2016-06-19 DIAGNOSIS — F411 Generalized anxiety disorder: Secondary | ICD-10-CM

## 2016-06-21 NOTE — Telephone Encounter (Signed)
Last refills 05/23/16 Last ov 01/2016

## 2016-06-22 ENCOUNTER — Other Ambulatory Visit: Payer: Self-pay | Admitting: Physician Assistant

## 2016-06-22 DIAGNOSIS — F411 Generalized anxiety disorder: Secondary | ICD-10-CM

## 2016-06-22 NOTE — Telephone Encounter (Signed)
Faxed in

## 2016-06-22 NOTE — Telephone Encounter (Signed)
Meds ordered this encounter  Medications  . promethazine (PHENERGAN) 25 MG tablet    Sig: TAKE ONE TABLET BY MOUTH EVERY 8 HOURS AS NEEDED FOR  NAUSEA    Dispense:  90 tablet    Refill:  0    Please consider 90 day supplies to promote better adherence  . clonazePAM (KLONOPIN) 1 MG tablet    Sig: TAKE ONE TABLET BY MOUTH TWICE DAILY AS NEEDED FOR  ANXIETY    Dispense:  60 tablet    Refill:  0

## 2016-06-27 ENCOUNTER — Other Ambulatory Visit: Payer: Self-pay | Admitting: Physician Assistant

## 2016-06-27 DIAGNOSIS — F411 Generalized anxiety disorder: Secondary | ICD-10-CM

## 2016-06-29 ENCOUNTER — Telehealth: Payer: Self-pay

## 2016-06-29 NOTE — Telephone Encounter (Signed)
Refilled 06/22/16

## 2016-06-29 NOTE — Telephone Encounter (Signed)
Pt is checking on status of a refill request for klonazapan and she is completely out  Best number [phone]

## 2016-06-30 ENCOUNTER — Other Ambulatory Visit: Payer: Self-pay | Admitting: Physician Assistant

## 2016-06-30 DIAGNOSIS — F411 Generalized anxiety disorder: Secondary | ICD-10-CM

## 2016-06-30 NOTE — Telephone Encounter (Signed)
L/m at pharmacy faxed in 06/19/16 clonazepam and promethazine, please fill or if this request was filled and she is requesting more, let us know. notfy pt. When ready too

## 2016-07-02 NOTE — Telephone Encounter (Signed)
This was authorized and faxed on 06/22/2016.

## 2016-07-02 NOTE — Telephone Encounter (Signed)
I have routed this message to PA [facility] as this is a controlled substance and PA Jacqulynn Cadet is managing the condition warranting this medication.

## 2016-07-31 ENCOUNTER — Ambulatory Visit (INDEPENDENT_AMBULATORY_CARE_PROVIDER_SITE_OTHER): Payer: Self-pay | Admitting: Physician Assistant

## 2016-07-31 VITALS — BP 138/90 | HR 93 | Temp 97.7°F | Resp 18 | Ht 69.0 in | Wt 203.4 lb

## 2016-07-31 DIAGNOSIS — F411 Generalized anxiety disorder: Secondary | ICD-10-CM

## 2016-07-31 DIAGNOSIS — L309 Dermatitis, unspecified: Secondary | ICD-10-CM

## 2016-07-31 DIAGNOSIS — K219 Gastro-esophageal reflux disease without esophagitis: Secondary | ICD-10-CM

## 2016-07-31 MED ORDER — CLONAZEPAM 1 MG PO TABS: 1.0000 mg | ORAL_TABLET | Freq: Two times a day (BID) | ORAL | 0 refills | Status: DC | PRN

## 2016-07-31 MED ORDER — KETOCONAZOLE 2 % EX CREA: 1.0000 "application " | TOPICAL_CREAM | Freq: Every day | CUTANEOUS | 1 refills | Status: DC

## 2016-07-31 MED ORDER — PROMETHAZINE HCL 25 MG PO TABS: 25.0000 mg | ORAL_TABLET | Freq: Three times a day (TID) | ORAL | 0 refills | Status: DC | PRN

## 2016-07-31 NOTE — Patient Instructions (Signed)
     IF you received an x-ray today, you will receive an invoice from [facility]. Please contact  Radiology at [phone] with questions or concerns regarding your invoice.   IF you received labwork today, you will receive an invoice from [facility]. Please contact [facility] at [phone] with questions or concerns regarding your invoice.   Our billing staff will not be able to assist you with questions regarding bills from these companies.  You will be contacted with the lab results as soon as they are available. The fastest way to get your results is to activate your My Chart account. Instructions are located on the last page of this paperwork. If you have not heard from us regarding the results in 2 weeks, please contact this office.     

## 2016-07-31 NOTE — Progress Notes (Signed)
   Daisy Tucker  MRN: [id number] DOB: 11-Jan-1976  PCP: Harrison Mons, PA-C  Chief Complaint  Patient presents with  . Medication Refill    Klonopin, Nizoral (cream)    Subjective:  Pt presents to clinic for medication refill. She ran out a few days ago and has not been able to see her PCP for refills.  She is doing well on her current dose but she thinks that she might want to decrease her klonopin again and is planning on talking with her PCP about this.  She also needs a refill on her antifungal medications.  Review of Systems  Patient Active Problem List   Diagnosis Date Noted  . Tinea corporis 06/08/2013  . GERD (gastroesophageal reflux disease) 05/04/2013  . Edema 01/05/2013  . Lumbar spondylosis with myelopathy 09/29/2012  . GAD (generalized anxiety disorder) 02/04/2012    Current Outpatient Prescriptions on File Prior to Visit  Medication Sig Dispense Refill  . gabapentin (NEURONTIN) 300 MG capsule Take 3 capsules (900 mg total) by mouth 3 (three) times daily. 270 capsule 5  . albuterol (PROVENTIL HFA;VENTOLIN HFA) 108 (90 Base) MCG/ACT inhaler Inhale 2 puffs into the lungs every 4 (four) hours as needed for wheezing or shortness of breath (cough, shortness of breath or wheezing.). (Patient not taking: Reported on 01/21/2016) 1 Inhaler 1  . nystatin (MYCOSTATIN) powder APPLY  POWDER TOPICALLY TO AFFECTED AREA TWICE DAILY (Patient not taking: Reported on 07/31/2016) 60 g 5   No current facility-administered medications on file prior to visit.     Allergies  Allergen Reactions  . Aspirin Other (See Comments)    Stomach intolerance per pt.  . Zofran Hives and Swelling    Swelling local at site of injection.    Pt patients past, family and social history were reviewed and updated.   Objective:  BP 138/90 (BP Location: Right Arm, Patient Position: Sitting, Cuff Size: Small)   Pulse 93   Temp 97.7 F (36.5 C) (Oral)   Resp 18   Ht 5\' 9"  (1.753 m)   Wt 203 lb  6.4 oz (92.3 kg)   LMP 07/31/2016   SpO2 97%   BMI 30.04 kg/m   Physical Exam  Constitutional: She is oriented to person, place, and time and well-developed, well-nourished, and in no distress.  HENT:  Head: Normocephalic and atraumatic.  Right Ear: Hearing and external ear normal.  Left Ear: Hearing and external ear normal.  Eyes: Conjunctivae are normal.  Neck: Normal range of motion.  Pulmonary/Chest: Effort normal.  Neurological: She is alert and oriented to person, place, and time. Gait normal.  Skin: Skin is warm and dry.  Psychiatric: Mood, memory, affect and judgment normal.  Vitals reviewed.   Assessment and Plan :  GAD (generalized anxiety disorder) - Plan: clonazePAM (KLONOPIN) 1 MG tablet  Dermatitis - Plan: ketoconazole (NIZORAL) 2 % cream  Gastroesophageal reflux disease without esophagitis - Plan: promethazine (PHENERGAN) 25 MG tablet   F/u with PCP in a month - she made an appt with her before she left the office today.  Windell Hummingbird PA-C  [facility] 08/01/2016 11:00 AM

## 2016-07-31 NOTE — Progress Notes (Deleted)
     Patient ID: Daisy Tucker, female    DOB: [birth year], 41 y.o.   MRN: [id number]  PCP: Harrison Mons, PA-C  Chief Complaint  Patient presents with  . Medication Refill    Klonopin, Nizoral (cream)    Subjective:   Presents for evaluation of ***.  ***.    Review of Systems     Patient Active Problem List   Diagnosis Date Noted  . Tinea corporis 06/08/2013  . GERD (gastroesophageal reflux disease) 05/04/2013  . Edema 01/05/2013  . Lumbar spondylosis with myelopathy 09/29/2012  . GAD (generalized anxiety disorder) 02/04/2012     Prior to Admission medications   Medication Sig Start Date End Date Taking? Authorizing Provider  clonazePAM (KLONOPIN) 1 MG tablet TAKE ONE TABLET BY MOUTH TWICE DAILY AS NEEDED FOR  ANXIETY 06/22/16  Yes Chelle Jeffery, PA-C  gabapentin (NEURONTIN) 300 MG capsule Take 3 capsules (900 mg total) by mouth 3 (three) times daily. 01/21/16  Yes Chelle Jeffery, PA-C  ketoconazole (NIZORAL) 2 % cream Apply 1 application topically daily. 01/21/16  Yes Chelle Jeffery, PA-C  promethazine (PHENERGAN) 25 MG tablet TAKE ONE TABLET BY MOUTH EVERY 8 HOURS AS NEEDED FOR  NAUSEA 06/22/16  Yes Chelle Jeffery, PA-C  albuterol (PROVENTIL HFA;VENTOLIN HFA) 108 (90 Base) MCG/ACT inhaler Inhale 2 puffs into the lungs every 4 (four) hours as needed for wheezing or shortness of breath (cough, shortness of breath or wheezing.). Patient not taking: Reported on 01/21/2016 09/09/15   Elby Beck, FNP  nystatin (MYCOSTATIN) powder APPLY  POWDER TOPICALLY TO AFFECTED AREA TWICE DAILY Patient not taking: Reported on 07/31/2016 06/15/15   Harrison Mons, PA-C     Allergies  Allergen Reactions  . Aspirin Other (See Comments)    Stomach intolerance per pt.  . Zofran Hives and Swelling    Swelling local at site of injection.       Objective:  Physical Exam         Assessment & Plan:   ***

## 2016-09-07 ENCOUNTER — Ambulatory Visit (INDEPENDENT_AMBULATORY_CARE_PROVIDER_SITE_OTHER): Payer: Self-pay | Admitting: Physician Assistant

## 2016-09-07 ENCOUNTER — Encounter: Payer: Self-pay | Admitting: Physician Assistant

## 2016-09-07 VITALS — BP 120/90 | HR 105 | Temp 98.3°F | Resp 18 | Ht 69.0 in | Wt 211.0 lb

## 2016-09-07 DIAGNOSIS — F411 Generalized anxiety disorder: Secondary | ICD-10-CM

## 2016-09-07 DIAGNOSIS — F17200 Nicotine dependence, unspecified, uncomplicated: Secondary | ICD-10-CM | POA: Insufficient documentation

## 2016-09-07 DIAGNOSIS — M4716 Other spondylosis with myelopathy, lumbar region: Secondary | ICD-10-CM

## 2016-09-07 DIAGNOSIS — F17290 Nicotine dependence, other tobacco product, uncomplicated: Secondary | ICD-10-CM

## 2016-09-07 DIAGNOSIS — K219 Gastro-esophageal reflux disease without esophagitis: Secondary | ICD-10-CM

## 2016-09-07 MED ORDER — PROMETHAZINE HCL 25 MG PO TABS: 25.0000 mg | ORAL_TABLET | Freq: Three times a day (TID) | ORAL | 5 refills | Status: DC | PRN

## 2016-09-07 MED ORDER — GABAPENTIN 300 MG PO CAPS: 900.0000 mg | ORAL_CAPSULE | Freq: Three times a day (TID) | ORAL | 5 refills | Status: DC

## 2016-09-07 MED ORDER — CLONAZEPAM 1 MG PO TABS: 1.0000 mg | ORAL_TABLET | Freq: Two times a day (BID) | ORAL | 0 refills | Status: DC | PRN

## 2016-09-07 NOTE — Patient Instructions (Signed)
     IF you received an x-ray today, you will receive an invoice from [facility]. Please contact [facility] at [phone] with questions or concerns regarding your invoice.   IF you received labwork today, you will receive an invoice from [facility]. Please contact [facility] at [phone] with questions or concerns regarding your invoice.   Our billing staff will not be able to assist you with questions regarding bills from these companies.  You will be contacted with the lab results as soon as they are available. The fastest way to get your results is to activate your My Chart account. Instructions are located on the last page of this paperwork. If you have not heard from us regarding the results in 2 weeks, please contact this office.     

## 2016-09-07 NOTE — Progress Notes (Signed)
Patient ID: Daisy Tucker, female    DOB: 10/28/1975, 41 y.o.   MRN: [id number]  PCP: Harrison Mons, PA-C  Chief Complaint  Patient presents with  . Medication Refill    Klonopin, Neurontin, Phenergan     Subjective:   Presents for evaluation of LBP and GAD.  Overall, doing well, though her pain and anxiety persist. Still uninsured. Had Medicaid briefly, for pregnancy, but it has expired.  54 year-old daughter is challenging presently. 29 year old daughter is also a challenge as a pre-teen.  Her husband has been working more, longer trips, sometimes home for only 24 hours each week.  Hasn't been able to afford gabapentin since she lost Medicaid. Hasn't been able to afford getting on her husband's health insurance. Taking a lot of NSAIDS, trying to stick to 800 mg BID, sometimes takes an additional 400 mg in a third dose. Needs to have back surgery and address her chronic nausea. Is almost ready to make substantial lifestyle changes in order to afford the treatment she needs. No loss of bowel or bladder control. No saddle anesthesia. No LE weakness.   Review of Systems As above. No CP, SOB, HA dizziness. No urinary urgency, frequency or burning.    Patient Active Problem List   Diagnosis Date Noted  . Tinea corporis 06/08/2013  . GERD (gastroesophageal reflux disease) 05/04/2013  . Edema 01/05/2013  . Lumbar spondylosis with myelopathy 09/29/2012  . GAD (generalized anxiety disorder) 02/04/2012     Prior to Admission medications   Medication Sig Start Date End Date Taking? Authorizing Provider  clonazePAM (KLONOPIN) 1 MG tablet Take 1 tablet (1 mg total) by mouth 2 (two) times daily as needed. for anxiety 07/31/16  Yes Mancel Bale, PA-C  gabapentin (NEURONTIN) 300 MG capsule Take 3 capsules (900 mg total) by mouth 3 (three) times daily. 01/21/16  Yes Kairos Panetta, PA-C  promethazine (PHENERGAN) 25 MG tablet Take 1 tablet (25 mg total) by mouth every 8  (eight) hours as needed. for nausea 07/31/16  Yes Mancel Bale, PA-C  albuterol (PROVENTIL HFA;VENTOLIN HFA) 108 (90 Base) MCG/ACT inhaler Inhale 2 puffs into the lungs every 4 (four) hours as needed for wheezing or shortness of breath (cough, shortness of breath or wheezing.). Patient not taking: Reported on 01/21/2016 09/09/15   Elby Beck, FNP  ketoconazole (NIZORAL) 2 % cream Apply 1 application topically daily. Patient not taking: Reported on 09/07/2016 07/31/16   Mancel Bale, PA-C  nystatin (MYCOSTATIN) powder APPLY  POWDER TOPICALLY TO AFFECTED AREA TWICE DAILY Patient not taking: Reported on 07/31/2016 06/15/15   Harrison Mons, PA-C     Allergies  Allergen Reactions  . Aspirin Other (See Comments)    Stomach intolerance per pt.  . Zofran Hives and Swelling    Swelling local at site of injection.       Objective:  Physical Exam  Constitutional: She is oriented to person, place, and time. She appears well-developed and well-nourished. She is active and cooperative. No distress.  BP 120/90 (BP Location: Left Arm, Patient Position: Sitting, Cuff Size: Normal)   Pulse (!) 105   Temp 98.3 F (36.8 C) (Oral)   Resp 18   Ht 5\' 9"  (1.753 m)   Wt 211 lb (95.7 kg)   LMP 08/28/2016   SpO2 96%   BMI 31.16 kg/m   HENT:  Head: Normocephalic and atraumatic.  Right Ear: Hearing normal.  Left Ear: Hearing normal.  Eyes: Conjunctivae are  normal. No scleral icterus.  Neck: Normal range of motion. Neck supple. No thyromegaly present.  Cardiovascular: Normal rate, regular rhythm and normal heart sounds.   Pulses:      Radial pulses are 2+ on the right side, and 2+ on the left side.  Pulmonary/Chest: Effort normal and breath sounds normal.  Musculoskeletal:       Cervical back: Normal.       Thoracic back: Normal.       Lumbar back: She exhibits tenderness, bony tenderness and pain. She exhibits no swelling, no edema, no deformity and no laceration.  Lymphadenopathy:       Head  (right side): No tonsillar, no preauricular, no posterior auricular and no occipital adenopathy present.       Head (left side): No tonsillar, no preauricular, no posterior auricular and no occipital adenopathy present.    She has no cervical adenopathy.       Right: No supraclavicular adenopathy present.       Left: No supraclavicular adenopathy present.  Neurological: She is alert and oriented to person, place, and time. No sensory deficit.  Skin: Skin is warm, dry and intact. No rash noted. No cyanosis or erythema. Nails show no clubbing.  Psychiatric: She has a normal mood and affect. Her speech is normal and behavior is normal.           Assessment & Plan:   Problem List Items Addressed This Visit    GAD (generalized anxiety disorder) (Chronic)    Stable. Controlled. Continue clonazepam.  When she has health insurance, will again encourage psychiatric evaluation and more appropriate maintenance treatment.      Relevant Medications   clonazePAM (KLONOPIN) 1 MG tablet   Lumbar spondylosis with myelopathy - Primary    Refill of gabapentin, but she cannot yet afford it. Encouraged her to pursue ways to obtain health insurance in order to address her chronic issue causing pain.      Relevant Medications   gabapentin (NEURONTIN) 300 MG capsule   GERD (gastroesophageal reflux disease)    Controlled.      Relevant Medications   promethazine (PHENERGAN) 25 MG tablet   Nicotine dependence    Encouraged continued efforts to eliminate nicotine.          Return in about 6 months (around 03/09/2017) for re-evaluation of back pain, anxiety, nausea.   Fara Chute, PA-C [facility] [place]

## 2016-09-11 NOTE — Assessment & Plan Note (Signed)
Stable. Controlled. Continue clonazepam.  When she has health insurance, will again encourage psychiatric evaluation and more appropriate maintenance treatment.

## 2016-09-11 NOTE — Assessment & Plan Note (Signed)
Controlled.  

## 2016-09-11 NOTE — Assessment & Plan Note (Signed)
Refill of gabapentin, but she cannot yet afford it. Encouraged her to pursue ways to obtain health insurance in order to address her chronic issue causing pain.

## 2016-09-11 NOTE — Assessment & Plan Note (Signed)
Encouraged continued efforts to eliminate nicotine.

## 2016-10-06 ENCOUNTER — Other Ambulatory Visit: Payer: Self-pay | Admitting: Physician Assistant

## 2016-10-06 DIAGNOSIS — F411 Generalized anxiety disorder: Secondary | ICD-10-CM

## 2016-10-06 NOTE — Telephone Encounter (Signed)
Meds ordered this encounter  Medications  . clonazePAM (KLONOPIN) 1 MG tablet    Sig: TAKE 1 TABLET BY MOUTH TWICE DAILY AS NEEDED FOR ANXIETY    Dispense:  60 tablet    Refill:  0

## 2016-10-07 NOTE — Telephone Encounter (Signed)
RX called into [facility] and pt notified via vm

## 2016-11-06 ENCOUNTER — Other Ambulatory Visit: Payer: Self-pay | Admitting: Physician Assistant

## 2016-11-06 DIAGNOSIS — F411 Generalized anxiety disorder: Secondary | ICD-10-CM

## 2016-11-06 NOTE — Telephone Encounter (Signed)
Meds ordered this encounter  Medications  . clonazePAM (KLONOPIN) 1 MG tablet    Sig: TAKE 1 TABLET BY MOUTH TWICE DAILY AS NEEDED FOR ANXIETY    Dispense:  60 tablet    Refill:  0

## 2016-11-09 ENCOUNTER — Other Ambulatory Visit: Payer: Self-pay | Admitting: Physician Assistant

## 2016-11-09 DIAGNOSIS — F411 Generalized anxiety disorder: Secondary | ICD-10-CM

## 2016-12-02 ENCOUNTER — Other Ambulatory Visit: Payer: Self-pay | Admitting: Physician Assistant

## 2016-12-02 DIAGNOSIS — F411 Generalized anxiety disorder: Secondary | ICD-10-CM

## 2016-12-03 ENCOUNTER — Telehealth: Payer: Self-pay

## 2016-12-03 NOTE — Telephone Encounter (Signed)
Klonopin Rx faxed to pharmacy and received a success notice.

## 2016-12-03 NOTE — Telephone Encounter (Signed)
Meds ordered this encounter  Medications  . clonazePAM (KLONOPIN) 1 MG tablet    Sig: TAKE 1 TABLET BY MOUTH TWICE DAILY AS NEEDED FOR ANXIETY    Dispense:  60 tablet    Refill:  0

## 2016-12-14 ENCOUNTER — Other Ambulatory Visit: Payer: Self-pay | Admitting: Physician Assistant

## 2016-12-14 DIAGNOSIS — F411 Generalized anxiety disorder: Secondary | ICD-10-CM

## 2016-12-17 ENCOUNTER — Other Ambulatory Visit: Payer: Self-pay | Admitting: Physician Assistant

## 2016-12-17 DIAGNOSIS — F411 Generalized anxiety disorder: Secondary | ICD-10-CM

## 2016-12-18 ENCOUNTER — Telehealth: Payer: Self-pay | Admitting: Physician Assistant

## 2016-12-18 NOTE — Telephone Encounter (Signed)
Last filled 7/19 - can not refill until 8/18

## 2016-12-18 NOTE — Telephone Encounter (Signed)
ERROR

## 2016-12-18 NOTE — Telephone Encounter (Signed)
[place] about [place] [phone]

## 2017-01-08 ENCOUNTER — Telehealth: Payer: Self-pay | Admitting: Physician Assistant

## 2017-01-08 DIAGNOSIS — F411 Generalized anxiety disorder: Secondary | ICD-10-CM

## 2017-01-08 NOTE — Telephone Encounter (Signed)
Meds ordered this encounter  Medications  . clonazePAM (KLONOPIN) 1 MG tablet    Sig: TAKE 1 TABLET BY MOUTH TWICE DAILY AS NEEDED FOR ANXIETY    Dispense:  60 tablet    Refill:  0   Patient notified via My Chart. Please fax/call to pharmacy.

## 2017-01-08 NOTE — Telephone Encounter (Signed)
Pleas advise. Thank you

## 2017-01-11 NOTE — Telephone Encounter (Signed)
Spoke with pt this Morning and she has received her RX.

## 2017-03-17 ENCOUNTER — Encounter: Payer: Self-pay | Admitting: Physician Assistant

## 2017-03-17 ENCOUNTER — Ambulatory Visit (INDEPENDENT_AMBULATORY_CARE_PROVIDER_SITE_OTHER): Payer: Self-pay | Admitting: Physician Assistant

## 2017-03-17 VITALS — BP 122/80 | HR 100 | Temp 97.7°F | Resp 18 | Ht 69.0 in | Wt 212.6 lb

## 2017-03-17 DIAGNOSIS — K219 Gastro-esophageal reflux disease without esophagitis: Secondary | ICD-10-CM

## 2017-03-17 DIAGNOSIS — F411 Generalized anxiety disorder: Secondary | ICD-10-CM

## 2017-03-17 MED ORDER — CLONAZEPAM 1 MG PO TABS: 1.0000 mg | ORAL_TABLET | Freq: Two times a day (BID) | ORAL | 0 refills | Status: DC | PRN

## 2017-03-17 MED ORDER — PROMETHAZINE HCL 25 MG PO TABS: 25.0000 mg | ORAL_TABLET | Freq: Three times a day (TID) | ORAL | 5 refills | Status: DC | PRN

## 2017-03-17 NOTE — Progress Notes (Signed)
Patient ID: Daisy Tucker, female    DOB: 05/01/1976, 41 y.o.   MRN: [id number]  PCP: Harrison Mons, PA-C  Chief Complaint  Patient presents with  . Medication Refill    Clonazepam 1 MG, Promethazine 25 MG    Subjective:   Presents for evaluation of generalized anxiety disorder.  Stable. Meds are working well. No adverse effects.   Review of Systems No chest pain, SOB, HA, dizziness, vision change, N/V, diarrhea, constipation, dysuria, urinary urgency or frequency, new/unexplained myalgias, arthralgias or rash.     Patient Active Problem List   Diagnosis Date Noted  . Nicotine dependence 09/07/2016  . Tinea corporis 06/08/2013  . GERD (gastroesophageal reflux disease) 05/04/2013  . Edema 01/05/2013  . Lumbar spondylosis with myelopathy 09/29/2012  . GAD (generalized anxiety disorder) 02/04/2012     Prior to Admission medications   Medication Sig Start Date End Date Taking? Authorizing Provider  clonazePAM (KLONOPIN) 1 MG tablet TAKE 1 TABLET BY MOUTH TWICE DAILY AS NEEDED FOR ANXIETY 01/08/17  Yes Calee Nugent, PA-C  promethazine (PHENERGAN) 25 MG tablet Take 1 tablet (25 mg total) by mouth every 8 (eight) hours as needed. for nausea 09/07/16  Yes Harrison Mons, PA-C     Allergies  Allergen Reactions  . Aspirin Other (See Comments)    Stomach intolerance per pt.  . Zofran Hives and Swelling    Swelling local at site of injection.       Objective:  Physical Exam  Constitutional: She is oriented to person, place, and time. She appears well-developed and well-nourished. She is active and cooperative. No distress.  BP 122/80 (BP Location: Left Arm, Patient Position: Sitting, Cuff Size: Large)   Pulse 100   Temp 97.7 F (36.5 C) (Oral)   Resp 18   Ht 5\' 9"  (1.753 m)   Wt 212 lb 9.6 oz (96.4 kg)   LMP 02/15/2017   SpO2 98%   BMI 31.40 kg/m   HENT:  Head: Normocephalic and atraumatic.  Right Ear: Hearing normal.  Left Ear: Hearing normal.    Eyes: Conjunctivae are normal. No scleral icterus.  Neck: Normal range of motion. Neck supple. No thyromegaly present.  Cardiovascular: Normal rate, regular rhythm and normal heart sounds.  Pulses:      Radial pulses are 2+ on the right side, and 2+ on the left side.  Pulmonary/Chest: Effort normal and breath sounds normal.  Lymphadenopathy:       Head (right side): No tonsillar, no preauricular, no posterior auricular and no occipital adenopathy present.       Head (left side): No tonsillar, no preauricular, no posterior auricular and no occipital adenopathy present.    She has no cervical adenopathy.       Right: No supraclavicular adenopathy present.       Left: No supraclavicular adenopathy present.  Neurological: She is alert and oriented to person, place, and time. No sensory deficit.  Skin: Skin is warm, dry and intact. No rash noted. No cyanosis or erythema. Nails show no clubbing.  Psychiatric: She has a normal mood and affect. Her speech is normal and behavior is normal.           Assessment & Plan:   Problem List Items Addressed This Visit    GAD (generalized anxiety disorder) - Primary (Chronic)    Stable. Controlled. Continue current treatment.      Relevant Medications   clonazePAM (KLONOPIN) 1 MG tablet   GERD (gastroesophageal reflux disease)  Stable with PRN promethazine.      Relevant Medications   promethazine (PHENERGAN) 25 MG tablet       Return in about 6 months (around 09/14/2017).   Fara Chute, PA-C [facility] [place]

## 2017-03-17 NOTE — Patient Instructions (Addendum)
Explore getting your pap test at the health Department or [facility].    IF you received an x-ray today, you will receive an invoice from [facility]. Please contact [facility] at [phone] with questions or concerns regarding your invoice.   IF you received labwork today, you will receive an invoice from [place]. Please contact [facility] at [phone] with questions or concerns regarding your invoice.   Our billing staff will not be able to assist you with questions regarding bills from these companies.  You will be contacted with the lab results as soon as they are available. The fastest way to get your results is to activate your My Chart account. Instructions are located on the last page of this paperwork. If you have not heard from [place] regarding the results in 2 weeks, please contact this office.

## 2017-04-04 NOTE — Assessment & Plan Note (Signed)
Stable. Controlled. Continue current treatment. 

## 2017-04-04 NOTE — Assessment & Plan Note (Signed)
Stable with PRN promethazine.

## 2017-04-13 ENCOUNTER — Other Ambulatory Visit: Payer: Self-pay | Admitting: Physician Assistant

## 2017-04-13 DIAGNOSIS — F411 Generalized anxiety disorder: Secondary | ICD-10-CM

## 2017-04-14 NOTE — Telephone Encounter (Signed)
Patient notified via My Chart. Rx sent electronically.  Meds ordered this encounter  Medications  . clonazePAM (KLONOPIN) 1 MG tablet    Sig: TAKE 1 TABLET BY MOUTH TWICE DAILY AS NEEDED FOR ANXIETY    Dispense:  60 tablet    Refill:  0

## 2017-05-06 ENCOUNTER — Other Ambulatory Visit: Payer: Self-pay | Admitting: Physician Assistant

## 2017-05-06 DIAGNOSIS — F411 Generalized anxiety disorder: Secondary | ICD-10-CM

## 2017-05-06 NOTE — Telephone Encounter (Signed)
Patient notified via My Chart.  Rx sent electronically.  Meds ordered this encounter  Medications  . clonazePAM (KLONOPIN) 1 MG tablet    Sig: TAKE 1 TABLET BY MOUTH TWICE DAILY AS NEEDED FOR ANXIETY    Dispense:  60 tablet    Refill:  0

## 2017-05-12 ENCOUNTER — Telehealth: Payer: Self-pay | Admitting: Physician Assistant

## 2017-05-12 NOTE — Telephone Encounter (Signed)
Fax from pharmacy asking to refill clonazepam early, as patient is going out of town. Please call pharmacy and authorize early fill, noting that this will not advance the next fill date, on/around 06/13/2017.

## 2017-05-13 NOTE — Telephone Encounter (Signed)
Authorized early refill.

## 2017-05-25 ENCOUNTER — Telehealth: Payer: Self-pay | Admitting: Physician Assistant

## 2017-05-25 DIAGNOSIS — F411 Generalized anxiety disorder: Secondary | ICD-10-CM

## 2017-05-25 NOTE — Telephone Encounter (Signed)
Copied from [place] [id number]. Topic: Quick Communication - Rx Refill/Question >> May 25, 2017  1:05 PM Malena Catholic I, NT wrote: Has the patient contacted their pharmacy yes   (Agent: If no, request that the patient contact the pharmacy for the refill Klonopin 1 Mg   Preferred Pharmacy (with phone number or street name [place] [phone] (her Pharmacy Don,t  have any at these moment    Agent: Please be advised that RX refills may take up to 3 business days. We ask that you follow-up with your pharmacy.

## 2017-05-26 NOTE — Telephone Encounter (Signed)
On 12/20, I sent clonazepam #60 (a 30-day supply). She should not be out. What is the problem?

## 2017-05-26 NOTE — Telephone Encounter (Signed)
Please advise 

## 2017-05-27 ENCOUNTER — Telehealth: Payer: Self-pay | Admitting: Physician Assistant

## 2017-05-27 NOTE — Telephone Encounter (Unsigned)
Copied from [place] [id number]. Topic: Quick Communication - See Telephone Encounter >> May 27, 2017  9:47 AM Hewitt Shorts wrote: CRM for notification. See Telephone encounter for: pt called today stating that when she got the clonazapan on 12/26 she only got 12 due to the pharmacy being short and she states that the pharmacy called stating they did not know when it be back in stock and not sure if any [facility] would have it and she needed to change to a different pharmacy but she did not know which pharmacy it would need to go yet  Best number   05/27/17.

## 2017-05-28 ENCOUNTER — Telehealth: Payer: Self-pay

## 2017-05-28 MED ORDER — CLONAZEPAM 1 MG PO TABS
1.0000 mg | ORAL_TABLET | Freq: Two times a day (BID) | ORAL | 0 refills | Status: DC | PRN
Start: 2017-05-28 — End: 2017-06-28

## 2017-05-28 NOTE — Telephone Encounter (Signed)
See other thread

## 2017-05-28 NOTE — Telephone Encounter (Signed)
Relation to pt: self  Call back number:  [phone] Pharmacy: [facility] [address] [place], [place] [address] [phone]     Reason for call:  Patient found a pharmacy who has clonazePAM (KLONOPIN) 1 MG tablet in stock, patient states she's been without for 2 days due to limited supply, patient requesting Rx today, please advise

## 2017-05-28 NOTE — Telephone Encounter (Signed)
Daisy Tucker      05/27/17 9:51 AM  Unsigned Note    Copied from [place] [id number]. Topic: Quick Communication - See Telephone Encounter >> May 27, 2017  9:47 AM Daisy Tucker wrote: CRM for notification. See Telephone encounter for: pt called today stating that when she got the clonazapan on 12/26 she only got 12 due to the pharmacy being short and she states that the pharmacy called stating they did not know when it be back in stock and not sure if [facility] would [facility] it and she needed to change to a different pharmacy but she did not know which pharmacy it would need to go yet  Best number   05/27/17.

## 2017-05-28 NOTE — Telephone Encounter (Signed)
Called pharmacy to confirm that patient only received 13 pills. [facility] supply is still out. I've changed pharmacies to the provided by the pt.

## 2017-05-28 NOTE — Telephone Encounter (Signed)
Rx sent electronically.  Meds ordered this encounter  Medications  . clonazePAM (KLONOPIN) 1 MG tablet    Sig: Take 1 tablet (1 mg total) by mouth 2 (two) times daily as needed. for anxiety    Dispense:  60 tablet    Refill:  0    Order Specific Question:   Supervising Provider    Answer:   Brigitte Pulse, EVA N [[id number]]

## 2017-05-29 NOTE — Telephone Encounter (Signed)
Error-Sign Encounter 

## 2017-06-28 ENCOUNTER — Other Ambulatory Visit: Payer: Self-pay | Admitting: Physician Assistant

## 2017-06-28 DIAGNOSIS — F411 Generalized anxiety disorder: Secondary | ICD-10-CM

## 2017-06-28 NOTE — Telephone Encounter (Signed)
Klonopin refill request  Controlled substance Pharmacy is [place], [place]

## 2017-06-29 NOTE — Telephone Encounter (Signed)
Please advise 

## 2017-06-30 NOTE — Telephone Encounter (Signed)
Rx sent electronically.  Meds ordered this encounter  Medications  . clonazePAM (KLONOPIN) 1 MG tablet    Sig: TAKE 1 TABLET BY MOUTH 2 TIMES DAILY AS NEEDED FOR ANXIETY    Dispense:  60 tablet    Refill:  0

## 2017-07-29 ENCOUNTER — Other Ambulatory Visit: Payer: Self-pay | Admitting: Physician Assistant

## 2017-07-29 DIAGNOSIS — F411 Generalized anxiety disorder: Secondary | ICD-10-CM

## 2017-07-29 NOTE — Telephone Encounter (Signed)
Medication  Refill  Request  Klonopin    CONTROLLED  SUBSTANCE    LOV  03-17-2017  Pharmacy  [facility]

## 2017-07-30 NOTE — Telephone Encounter (Signed)
Patient is requesting a refill of the following medications: Requested Prescriptions   Pending Prescriptions Disp Refills  . clonazePAM (KLONOPIN) 1 MG tablet [Pharmacy Med Name: CLONAZEPAM 1 MG TABLET 1 TAB] 60 tablet 0    Sig: TAKE 1 TABLET BY MOUTH 2 TIMES DAILY AS NEEDED FOR ANXIETY    Date of patient request:07/30/15 Last office visit:03/17/17 Date of last refill: 06/30/16 Last refill amount:  #30 0RF Follow up time period per chart: no scheduled

## 2017-08-19 ENCOUNTER — Encounter: Payer: Self-pay | Admitting: Physician Assistant

## 2017-09-02 ENCOUNTER — Other Ambulatory Visit: Payer: Self-pay | Admitting: Physician Assistant

## 2017-09-02 DIAGNOSIS — F411 Generalized anxiety disorder: Secondary | ICD-10-CM

## 2017-09-02 DIAGNOSIS — K219 Gastro-esophageal reflux disease without esophagitis: Secondary | ICD-10-CM

## 2017-09-02 NOTE — Telephone Encounter (Signed)
Refill request Klonopin  LOV 03/17/2017  Pharmacy  On  File

## 2017-09-02 NOTE — Telephone Encounter (Signed)
Patient called and advised she will need to schedule a 6 month F/U visit in order to have medication refilled, she agreed. Appointment scheduled for Wednesday, 09/15/17 at 1100 with Harrison Mons, PA.

## 2017-09-03 NOTE — Telephone Encounter (Signed)
Patient is requesting a refill of the following medications: Requested Prescriptions   Pending Prescriptions Disp Refills  . clonazePAM (KLONOPIN) 1 MG tablet [Pharmacy Med Name: CLONAZEPAM 1 MG TABLET 1 TAB] 60 tablet 0    Sig: TAKE 1 TABLET BY MOUTH 2 TIMES DAILY AS NEEDED FOR ANXIETY    Date of patient request: 09/03/2017 Last office visit: 03/17/2017 Date of last refill: 08/05/2017 Last refill amount: 60 Follow up time period per chart: has an apt 09/15/17

## 2017-09-15 ENCOUNTER — Other Ambulatory Visit: Payer: Self-pay

## 2017-09-15 ENCOUNTER — Ambulatory Visit: Payer: Self-pay | Admitting: Physician Assistant

## 2017-09-15 ENCOUNTER — Encounter: Payer: Self-pay | Admitting: Physician Assistant

## 2017-09-15 VITALS — BP 118/70 | HR 72 | Temp 98.2°F | Resp 16 | Ht 69.0 in | Wt 183.6 lb

## 2017-09-15 DIAGNOSIS — N393 Stress incontinence (female) (male): Secondary | ICD-10-CM

## 2017-09-15 DIAGNOSIS — N3090 Cystitis, unspecified without hematuria: Secondary | ICD-10-CM

## 2017-09-15 DIAGNOSIS — L989 Disorder of the skin and subcutaneous tissue, unspecified: Secondary | ICD-10-CM

## 2017-09-15 LAB — POCT URINALYSIS DIP (MANUAL ENTRY)
Bilirubin, UA: NEGATIVE
Blood, UA: NEGATIVE
Glucose, UA: NEGATIVE mg/dL
Nitrite, UA: POSITIVE — AB
Protein Ur, POC: NEGATIVE mg/dL
Spec Grav, UA: 1.025 (ref 1.010–1.025)
Urobilinogen, UA: 0.2 E.U./dL
pH, UA: 6 (ref 5.0–8.0)

## 2017-09-15 LAB — POC MICROSCOPIC URINALYSIS (UMFC): Mucus: ABSENT

## 2017-09-15 NOTE — Progress Notes (Signed)
Patient ID: Daisy Tucker, female    DOB: 06/15/1975, 42 y.o.   MRN: [id number]  PCP: Harrison Mons, PA-C  Chief Complaint  Patient presents with  . Anxiety    follow up     Subjective:   Presents for evaluation of anxiety.  Continues to use clonazepam alone for management.  Dark, smells strong, urgency and frequency. Monogamous sex with her husband. No atypical vaginal discharge.  Has almost eliminated carbs and sodas from her diet. Did her own version of Atkins Diet. Back feels so much better. Has been able to stop taking ibuprofen unless she does extra work. Walking outside more, generally more active. Has more energy.   Review of Systems As above.    Patient Active Problem List   Diagnosis Date Noted  . Nicotine dependence 09/07/2016  . Tinea corporis 06/08/2013  . GERD (gastroesophageal reflux disease) 05/04/2013  . Edema 01/05/2013  . Lumbar spondylosis with myelopathy 09/29/2012  . GAD (generalized anxiety disorder) 02/04/2012     Prior to Admission medications   Medication Sig Start Date End Date Taking? Authorizing Provider  clonazePAM (KLONOPIN) 1 MG tablet TAKE 1 TABLET BY MOUTH 2 TIMES DAILY AS NEEDED FOR ANXIETY 09/06/17  Yes Elayjah Chaney, PA-C  promethazine (PHENERGAN) 25 MG tablet TAKE 1 TABLET BY MOUTH EVERY 8 HOURS AS NEEDED FOR NAUSEA 09/02/17  Yes Akaisha Truman, PA-C     Allergies  Allergen Reactions  . Aspirin Other (See Comments)    Stomach intolerance per pt.  . Zofran Hives and Swelling    Swelling local at site of injection.       Objective:  Physical Exam  Constitutional: She is oriented to person, place, and time. She appears well-developed and well-nourished. She is active and cooperative. No distress.  BP 118/70   Pulse 72   Temp 98.2 F (36.8 C)   Resp 16   Ht 5\' 9"  (1.753 m)   Wt 183 lb 9.6 oz (83.3 kg)   SpO2 98%   BMI 27.11 kg/m   HENT:  Head: Normocephalic and atraumatic.  Right Ear: Hearing  normal.  Left Ear: Hearing normal.  Eyes: Conjunctivae are normal. No scleral icterus.  Neck: Normal range of motion. Neck supple. No thyromegaly present.  Cardiovascular: Normal rate, regular rhythm and normal heart sounds.  Pulses:      Radial pulses are 2+ on the right side, and 2+ on the left side.  Pulmonary/Chest: Effort normal and breath sounds normal.  Lymphadenopathy:       Head (right side): No tonsillar, no preauricular, no posterior auricular and no occipital adenopathy present.       Head (left side): No tonsillar, no preauricular, no posterior auricular and no occipital adenopathy present.    She has no cervical adenopathy.       Right: No supraclavicular adenopathy present.       Left: No supraclavicular adenopathy present.  Neurological: She is alert and oriented to person, place, and time. No sensory deficit.  Skin: Skin is warm, dry and intact. Lesion noted. No rash noted. No cyanosis or erythema. Nails show no clubbing.  Psychiatric: She has a normal mood and affect. Her speech is normal and behavior is normal.       Wt Readings from Last 3 Encounters:  09/15/17 183 lb 9.6 oz (83.3 kg)  03/17/17 212 lb 9.6 oz (96.4 kg)  09/07/16 211 lb (95.7 kg)   Results for orders placed or performed in visit on  09/15/17  POCT urinalysis dipstick  Result Value Ref Range   Color, UA yellow yellow   Clarity, UA clear clear   Glucose, UA negative negative mg/dL   Bilirubin, UA negative negative   Ketones, POC UA trace (5) (A) negative mg/dL   Spec Grav, UA 1.025 1.010 - 1.025   Blood, UA negative negative   pH, UA 6.0 5.0 - 8.0   Protein Ur, POC negative negative mg/dL   Urobilinogen, UA 0.2 0.2 or 1.0 E.U./dL   Nitrite, UA Positive (A) Negative   Leukocytes, UA Small (1+) (A) Negative  POCT Microscopic Urinalysis ([facility])  Result Value Ref Range   WBC,UR,HPF,POC Too numerous to count  (A) None WBC/hpf   RBC,UR,HPF,POC None None RBC/hpf   Bacteria Many (A) None, Too  numerous to count   Mucus Absent Absent   Epithelial Cells, UR Per Microscopy Many (A) None, Too numerous to count cells/hpf         Assessment & Plan:   1. Stress incontinence Likely due to cystitis. Await UCx. - POCT urinalysis dipstick - POCT Microscopic Urinalysis ([facility]) - Urine Culture  2. Skin lesion of left arm Schedule excisional biopsy at her convenience.  3. Cystitis Empiric treatment with SMX-TMP. Await UCx. - sulfamethoxazole-trimethoprim (BACTRIM DS,SEPTRA DS) 800-160 MG tablet; Take 1 tablet by mouth 2 (two) times daily. (Patient not taking: Reported on 10/06/2017)  Dispense: 10 tablet; Refill: 0    Return in about 3 weeks (around 10/06/2017) for lesion excision, LEFT arm.   Fara Chute, PA-C [facility] [place]

## 2017-09-15 NOTE — Patient Instructions (Signed)
     IF you received an x-ray today, you will receive an invoice from [facility]. Please contact [facility] at [phone] with questions or concerns regarding your invoice.   IF you received labwork today, you will receive an invoice from [facility]. Please contact [facility] at [phone] with questions or concerns regarding your invoice.   Our billing staff will not be able to assist you with questions regarding bills from these companies.  You will be contacted with the lab results as soon as they are available. The fastest way to get your results is to activate your My Chart account. Instructions are located on the last page of this paperwork. If you have not heard from us regarding the results in 2 weeks, please contact this office.     

## 2017-09-17 LAB — URINE CULTURE

## 2017-09-17 MED ORDER — SULFAMETHOXAZOLE-TRIMETHOPRIM 800-160 MG PO TABS: 1.0000 | ORAL_TABLET | Freq: Two times a day (BID) | ORAL | 0 refills | Status: DC

## 2017-10-06 ENCOUNTER — Encounter: Payer: Self-pay | Admitting: Physician Assistant

## 2017-10-06 ENCOUNTER — Telehealth: Payer: Self-pay | Admitting: Physician Assistant

## 2017-10-06 ENCOUNTER — Other Ambulatory Visit: Payer: Self-pay

## 2017-10-06 ENCOUNTER — Ambulatory Visit: Payer: Self-pay | Admitting: Physician Assistant

## 2017-10-06 VITALS — BP 112/70 | HR 94 | Temp 98.6°F | Resp 16 | Ht 69.37 in | Wt 181.6 lb

## 2017-10-06 DIAGNOSIS — F411 Generalized anxiety disorder: Secondary | ICD-10-CM

## 2017-10-06 DIAGNOSIS — L989 Disorder of the skin and subcutaneous tissue, unspecified: Secondary | ICD-10-CM

## 2017-10-06 NOTE — Progress Notes (Signed)
     Patient ID: Daisy Tucker, female    DOB: 30-Jun-1975, 42 y.o.   MRN: [id number]  PCP: Harrison Mons, PA-C  Chief Complaint  Patient presents with  . Cyst    lesion to be removed from left arm     Subjective:   Presents for excision of a lesion on the back of the left arm.  Nodular lesion on the back of the left arm measuring nearly 1 cm and growing in size for the past year.  She is ready for it to be removed.  No previous lesion like this.  It is not painful.  She is worried that it represents malignancy.    Review of Systems As above.    Patient Active Problem List   Diagnosis Date Noted  . Nicotine dependence 09/07/2016  . Tinea corporis 06/08/2013  . GERD (gastroesophageal reflux disease) 05/04/2013  . Edema 01/05/2013  . Lumbar spondylosis with myelopathy 09/29/2012  . GAD (generalized anxiety disorder) 02/04/2012     Prior to Admission medications   Medication Sig Start Date End Date Taking? Authorizing Provider  clonazePAM (KLONOPIN) 1 MG tablet TAKE 1 TABLET BY MOUTH 2 TIMES DAILY AS NEEDED FOR ANXIETY 09/06/17  Yes Jeffery, Chelle, PA-C  promethazine (PHENERGAN) 25 MG tablet TAKE 1 TABLET BY MOUTH EVERY 8 HOURS AS NEEDED FOR NAUSEA 09/02/17  Yes Jeffery, Chelle, PA-C  sulfamethoxazole-trimethoprim (BACTRIM DS,SEPTRA DS) 800-160 MG tablet Take 1 tablet by mouth 2 (two) times daily. Patient not taking: Reported on 10/06/2017 09/17/17   Harrison Mons, PA-C     Allergies  Allergen Reactions  . Aspirin Other (See Comments)    Stomach intolerance per pt.  . Zofran Hives and Swelling    Swelling local at site of injection.       Objective:  Physical Exam  Constitutional: She is oriented to person, place, and time. She appears well-developed and well-nourished. She is active and cooperative. No distress.  BP 112/70   Pulse 94   Temp 98.6 F (37 C)   Resp 16   Ht 5' 9.37" (1.762 m)   Wt 181 lb 9.6 oz (82.4 kg)   SpO2 97%   BMI 26.53 kg/m      Eyes: Conjunctivae are normal.  Pulmonary/Chest: Effort normal.  Neurological: She is alert and oriented to person, place, and time.  Psychiatric: She has a normal mood and affect. Her speech is normal and behavior is normal.     Verbal consent obtain from the patient.   Skin cleansed with alcohol pad, then local anesthesia with 10 cc 2% lidocaine with epinephrine.   Excisional ellipital biopsy performed with 15 blade and specimen sent for pathology review.   Closed with #5 3-0 Prolene horizontal mattress sutures..   Bandage applied.   Local wound care reviewed.      Assessment & Plan:   1. Skin lesion of left arm - Dermatology pathology    Return for suture removal in 10-14 days.   Fara Chute, PA-C [facility]

## 2017-10-06 NOTE — Patient Instructions (Addendum)
WOUND CARE Please return in 10-14 days to have your stitches/staples removed or sooner if you have concerns. Marland Kitchen Keep area clean and dry for 24 hours. Do not remove bandage, if applied. . After 24 hours, remove bandage and wash wound gently with mild soap and warm water. Reapply a new bandage after cleaning wound, if directed. . Continue daily cleansing with soap and water until stitches/staples are removed. . Do not apply any ointments or creams to the wound while stitches/staples are in place, as this may cause delayed healing. . Notify the office if you experience any of the following signs of infection: Swelling, redness, pus drainage, streaking, fever >101.0 F . Notify the office if you experience excessive bleeding that does not stop after 15-20 minutes of constant, firm pressure.      IF you received an x-ray today, you will receive an invoice from [facility]. Please contact [facility] at [phone] with questions or concerns regarding your invoice.   IF you received labwork today, you will receive an invoice from [place]. Please contact [facility] at [phone] with questions or concerns regarding your invoice.   Our billing staff will not be able to assist you with questions regarding bills from these companies.  You will be contacted with the lab results as soon as they are available. The fastest way to get your results is to activate your My Chart account. Instructions are located on the last page of this paperwork. If you have not heard from [place] regarding the results in 2 weeks, please contact this office.

## 2017-10-06 NOTE — Telephone Encounter (Signed)
Copied from [place] #[id number]. Topic: Quick Communication - See Telephone Encounter >> Oct 06, 2017  3:37 PM Vernona Rieger wrote: CRM for notification. See Telephone encounter for: 10/06/17.  Patient said she was in the office today and saw Chelle, she was suppose to fax over her prescription for her clonazePAM (KLONOPIN) 1 MG tablet. Please send to [place], [place]

## 2017-10-08 ENCOUNTER — Other Ambulatory Visit: Payer: Self-pay

## 2017-10-08 DIAGNOSIS — F411 Generalized anxiety disorder: Secondary | ICD-10-CM

## 2017-10-08 NOTE — Telephone Encounter (Signed)
Please advise. Med pended.

## 2017-10-09 MED ORDER — CLONAZEPAM 1 MG PO TABS: ORAL_TABLET | ORAL | 0 refills | Status: AC

## 2017-10-09 MED ORDER — CLONAZEPAM 1 MG PO TABS: 1.0000 mg | ORAL_TABLET | Freq: Two times a day (BID) | ORAL | 0 refills | Status: AC | PRN

## 2017-10-09 NOTE — Telephone Encounter (Signed)
I misunderstood, and thought we were going to do this when she came in for the suture removal.  Meds ordered this encounter  Medications  . clonazePAM (KLONOPIN) 1 MG tablet    Sig: TAKE 1 TABLET BY MOUTH 2 TIMES DAILY AS NEEDED FOR ANXIETY    Dispense:  60 tablet    Refill:  0    Order Specific Question:   Supervising Provider    Answer:   SHAW, EVA N [[id number]]  . clonazePAM (KLONOPIN) 1 MG tablet    Sig: Take 1 tablet (1 mg total) by mouth 2 (two) times daily as needed for anxiety.    Dispense:  60 tablet    Refill:  0    Order Specific Question:   Supervising Provider    Answer:   SHAW, EVA N [[id number]]  . clonazePAM (KLONOPIN) 1 MG tablet    Sig: Take 1 tablet (1 mg total) by mouth 2 (two) times daily as needed for anxiety.    Dispense:  60 tablet    Refill:  0    Order Specific Question:   Supervising Provider    Answer:   Brigitte Pulse, EVA N [[id number]]

## 2017-10-16 ENCOUNTER — Encounter: Payer: Self-pay | Admitting: Physician Assistant

## 2017-10-16 ENCOUNTER — Ambulatory Visit (INDEPENDENT_AMBULATORY_CARE_PROVIDER_SITE_OTHER): Payer: Self-pay | Admitting: Physician Assistant

## 2017-10-16 ENCOUNTER — Other Ambulatory Visit: Payer: Self-pay

## 2017-10-16 VITALS — BP 118/82 | HR 91 | Temp 98.0°F | Resp 16 | Ht 69.0 in | Wt 184.0 lb

## 2017-10-16 DIAGNOSIS — Z4802 Encounter for removal of sutures: Secondary | ICD-10-CM

## 2017-10-16 DIAGNOSIS — D239 Other benign neoplasm of skin, unspecified: Secondary | ICD-10-CM

## 2017-10-16 NOTE — Progress Notes (Signed)
     Chief Complaint  Patient presents with  . Suture / Staple Removal    History of Present Illness: Patient presents for suture removal.  On 10/06/2017, we excised a discolored, nodular lesion from the back of the LEFT upper arm. Pathology revealed dermatofibroma, with free margins.  Wound is healing nicely, without pain, swelling, redness or drainage.   Allergies  Allergen Reactions  . Aspirin Other (See Comments)    Stomach intolerance per pt.  . Zofran Hives and Swelling    Swelling local at site of injection.    Prior to Admission medications   Medication Sig Start Date End Date Taking? Authorizing Provider  clonazePAM (KLONOPIN) 1 MG tablet TAKE 1 TABLET BY MOUTH 2 TIMES DAILY AS NEEDED FOR ANXIETY 10/09/17  Yes Atlas Kuc, PA-C  clonazePAM (KLONOPIN) 1 MG tablet Take 1 tablet (1 mg total) by mouth 2 (two) times daily as needed for anxiety. 11/08/17  Yes Ahaana Rochette, PA-C  clonazePAM (KLONOPIN) 1 MG tablet Take 1 tablet (1 mg total) by mouth 2 (two) times daily as needed for anxiety. 12/08/17  Yes Aulton Routt, PA-C  promethazine (PHENERGAN) 25 MG tablet TAKE 1 TABLET BY MOUTH EVERY 8 HOURS AS NEEDED FOR NAUSEA 09/02/17  Yes Harrison Mons, PA-C    Patient Active Problem List   Diagnosis Date Noted  . Nicotine dependence 09/07/2016  . Tinea corporis 06/08/2013  . GERD (gastroesophageal reflux disease) 05/04/2013  . Edema 01/05/2013  . Lumbar spondylosis with myelopathy 09/29/2012  . GAD (generalized anxiety disorder) 02/04/2012     Physical Exam  Constitutional: She is oriented to person, place, and time. She appears well-developed and well-nourished. She is active and cooperative. No distress.  BP 118/82   Pulse 91   Temp 98 F (36.7 C) (Oral)   Resp 16   Ht 5\' 9"  (1.753 m)   Wt 184 lb (83.5 kg)   SpO2 97%   BMI 27.17 kg/m    Eyes: Conjunctivae are normal.  Pulmonary/Chest: Effort normal.  Neurological: She is alert and oriented to person,  place, and time.  Psychiatric: She has a normal mood and affect. Her speech is normal and behavior is normal.   Well-healed surgical wound posterior LEFT upper arm. #4 Prolene horizontal mattress sutures are intact, and removed without incident.   ASSESSMENT & PLAN:  1. Dermatofibroma 2. Visit for suture removal Continue local wound care.     No follow-ups on file.   Fara Chute, PA-C [facility] [place]

## 2017-10-16 NOTE — Patient Instructions (Signed)
     IF you received an x-ray today, you will receive an invoice from [facility]. Please contact [facility] at [phone] with questions or concerns regarding your invoice.   IF you received labwork today, you will receive an invoice from [facility]. Please contact [facility] at [phone] with questions or concerns regarding your invoice.   Our billing staff will not be able to assist you with questions regarding bills from these companies.  You will be contacted with the lab results as soon as they are available. The fastest way to get your results is to activate your My Chart account. Instructions are located on the last page of this paperwork. If you have not heard from us regarding the results in 2 weeks, please contact this office.     

## 2020-09-18 ENCOUNTER — Encounter (HOSPITAL_COMMUNITY): Payer: Self-pay

## 2020-09-18 ENCOUNTER — Emergency Department (HOSPITAL_COMMUNITY)
Admission: EM | Admit: 2020-09-18 | Discharge: 2020-09-18 | Disposition: A | Discharge status: Home / Self Care | Payer: 59 | Attending: Emergency Medicine | Admitting: Emergency Medicine

## 2020-09-18 ENCOUNTER — Other Ambulatory Visit: Payer: Self-pay

## 2020-09-18 ENCOUNTER — Emergency Department (HOSPITAL_COMMUNITY): Payer: 59

## 2020-09-18 DIAGNOSIS — W228XXA Striking against or struck by other objects, initial encounter: Secondary | ICD-10-CM | POA: Diagnosis not present

## 2020-09-18 DIAGNOSIS — Z87891 Personal history of nicotine dependence: Secondary | ICD-10-CM | POA: Insufficient documentation

## 2020-09-18 DIAGNOSIS — M542 Cervicalgia: Secondary | ICD-10-CM | POA: Insufficient documentation

## 2020-09-18 DIAGNOSIS — S0990XA Unspecified injury of head, initial encounter: Secondary | ICD-10-CM | POA: Diagnosis not present

## 2020-09-18 MED ORDER — LIDOCAINE 5 % EX PTCH
1.0000 | MEDICATED_PATCH | Freq: Once | CUTANEOUS | Status: DC
  Administered 2020-09-18: 1 via TRANSDERMAL
  Filled 2020-09-18: qty 1

## 2020-09-18 MED ORDER — OXYCODONE-ACETAMINOPHEN 5-325 MG PO TABS
1.0000 | ORAL_TABLET | Freq: Once | ORAL | Status: AC
  Administered 2020-09-18: 1 via ORAL
  Filled 2020-09-18: qty 1

## 2020-09-18 MED ORDER — TIZANIDINE HCL 4 MG PO TABS: 4.0000 mg | ORAL_TABLET | Freq: Four times a day (QID) | ORAL | 0 refills | Status: AC | PRN

## 2020-09-18 MED ORDER — KETOROLAC TROMETHAMINE 30 MG/ML IJ SOLN
30.0000 mg | Freq: Once | INTRAMUSCULAR | Status: AC
  Administered 2020-09-18: 30 mg via INTRAMUSCULAR
  Filled 2020-09-18: qty 1

## 2020-09-18 MED ORDER — METHYLPREDNISOLONE 4 MG PO TBPK: ORAL_TABLET | ORAL | 0 refills | Status: AC

## 2020-09-18 MED ORDER — DIAZEPAM 5 MG PO TABS
5.0000 mg | ORAL_TABLET | Freq: Once | ORAL | Status: AC
  Administered 2020-09-18: 5 mg via ORAL
  Filled 2020-09-18: qty 1

## 2020-09-18 MED ORDER — NAPROXEN 500 MG PO TABS: 500.0000 mg | ORAL_TABLET | Freq: Two times a day (BID) | ORAL | 0 refills | Status: AC

## 2020-09-18 NOTE — ED Provider Notes (Signed)
[facility] DEPT Provider Note   CSN: [id number] Arrival date & time: 09/18/20  1228     History Chief Complaint  Patient presents with  . Head Injury    Daisy Tucker is a 45 y.o. female.  Lenora Gomes Jurgensen is a 45 y.o. female with a history of GERD, anxiety, L-spine surgery, who presents for evaluation of head injury and neck pain. Pt reports a week ago she hit her head when quickly getting into a car and this jerked her neck. She thinks she may have very briefly lost consciousness. Reports initially she had some mild headaches, but now her primary concern is worsening neck pain. She reports that she has had issues with her neck for at least the past year, but pain has been significantly worse since she hit her head. She has had some brief tingling, but no sustained paresthesias, no numbness or weakness. She has tried OTC meds without relief. No other aggravating or alleviating factors.        Past Medical History:  Diagnosis Date  . Anxiety   . Drug use complicating pregnancy 66/0/6301   05/04/13 drug screen showed negative for benzos - DO NOT GIVE REFILL ON PRESCRIPTION FOR BENZOS!!!!! Positive for prescribed Klonopin.  Also positive for Suboxone/Subutex and unknown opiate.  Needs drug screening again in third trimester.     Marland Kitchen GERD (gastroesophageal reflux disease)   . Miscarriage    Rh negative  . Nausea   . PNA (pneumonia)   . Rh negative status during pregnancy in second trimester, antepartum 03/31/2013  . Supervision of high-risk pregnancy 03/01/2013   05/04/13 drug screen showed negative for benzos - DO NOT GIVE REFILL ON PRESCRIPTION FOR BENZOS!!!!!  Clinic [facility]  Dating  Currently based on LMP.    Genetic Screen Panorama requested--drawn 10/30--WNL  Anatomic US wnl--limited views of heart--repeat in 4 weeks--ordered--WNL  GTT   Third trimester: 1 hour 107  TDaP vaccine declines  Flu vaccine declines  GBS neg  Baby Food  breast    Contraception    . Vaginal Pap smear, abnormal    f/u was ok    Patient Active Problem List   Diagnosis Date Noted  . Nicotine dependence 09/07/2016  . Tinea corporis 06/08/2013  . GERD (gastroesophageal reflux disease) 05/04/2013  . Edema 01/05/2013  . Lumbar spondylosis with myelopathy 09/29/2012  . GAD (generalized anxiety disorder) 02/04/2012    Past Surgical History:  Procedure Laterality Date  . SPINE SURGERY  2008   L4-5 disc reconstruction     OB History    Gravida  3   Para  2   Term  2   Preterm  0   AB  1   Living  2     SAB  0   IAB  1   Ectopic  0   Multiple  0   Live Births  2           Family History  Problem Relation Age of Onset  . Stroke Father        x 3  . Alcohol abuse Mother   . Alcohol abuse Brother   . Drug abuse Brother        IVD  . Cancer Maternal Grandmother        lung  . Stroke Paternal Grandmother   . Hearing loss Neg Hx     Social History   Tobacco Use  . Smoking status: Former Smoker  Types: Cigarettes    Quit date: 03/28/2013    Years since quitting: 7.4  . Smokeless tobacco: Never Used  . Tobacco comment: patient is on electronic cigarette ; she is trying to quit.  Vaping Use  . Vaping Use: Every day  Substance Use Topics  . Alcohol use: No  . 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 Authorizing Provider  methylPREDNISolone (MEDROL DOSEPAK) 4 MG TBPK tablet Take as directed 09/18/20  Yes Jacqlyn Larsen, PA-C  naproxen (NAPROSYN) 500 MG tablet Take 1 tablet (500 mg total) by mouth 2 (two) times daily. 09/18/20  Yes Jacqlyn Larsen, PA-C  tiZANidine (ZANAFLEX) 4 MG tablet Take 1 tablet (4 mg total) by mouth every 6 (six) hours as needed for muscle spasms. 09/18/20  Yes Paxton Kanaan, Audery Amel, PA-C  clonazePAM (KLONOPIN) 1 MG tablet TAKE 1 TABLET BY MOUTH 2 TIMES DAILY AS NEEDED FOR ANXIETY 10/09/17   Harrison Mons, PA  clonazePAM (KLONOPIN) 1 MG tablet Take 1 tablet (1 mg total) by mouth 2 (two) times daily as needed for anxiety. 11/08/17   Harrison Mons, PA  clonazePAM (KLONOPIN) 1 MG tablet Take 1 tablet (1 mg total) by mouth 2 (two) times daily as needed for anxiety. 12/08/17   Harrison Mons, PA  promethazine (PHENERGAN) 25 MG tablet TAKE 1 TABLET BY MOUTH EVERY 8 HOURS AS NEEDED FOR NAUSEA 09/02/17   Harrison Mons, PA    Allergies    Aspirin and Zofran  Review of Systems   Review of Systems  Constitutional: Negative for chills and fever.  HENT: Negative.   Eyes: Negative for visual disturbance.  Respiratory: Negative for shortness of breath.   Cardiovascular: Negative for chest pain.  Musculoskeletal: Positive for neck pain and neck stiffness. Negative for arthralgias, back pain and myalgias.  Skin: Negative for color change, rash and wound.  Neurological: Positive for headaches. Negative for dizziness, syncope, weakness and numbness.  All other systems reviewed and are negative.   Physical Exam Updated Vital Signs BP (!) 147/97 (BP Location: Left Arm)   Pulse (!) 118   Temp 97.7 F (36.5 C) (Oral)   Resp 18   LMP 09/18/2020   SpO2 99%   Physical Exam Vitals and nursing note reviewed.  Constitutional:      General: She is not in acute distress.    Appearance: Normal appearance. She is well-developed. She is not ill-appearing or diaphoretic.     Comments: Pt appears uncomfortable, but in no acute distress  HENT:     Head: Normocephalic and atraumatic.     Comments: Scalp without signs of trauma, no palpable hematoma, no step-off, negative battle sign Eyes:     General:        Right eye: No discharge.        Left eye: No discharge.     Extraocular Movements: Extraocular movements intact.     Pupils: Pupils are equal, round, and reactive to light.   Neck:     Comments: Tenderness over the posterior neck musculature with palpable spasm bilaterally, focal midline tenderness over C7 spinous process, no stepoff or deformity Cardiovascular:     Rate and Rhythm: Normal rate.  Pulmonary:     Effort: Pulmonary effort is normal. No  respiratory distress.  Musculoskeletal:        General: No deformity.     Cervical back: Tenderness present.     Comments: No midline thoracic or lumbar spin tenderness, moves all extremities without difficulty  Skin:    General: Skin is warm and dry.  Neurological:     Mental Status: She is alert and oriented to person, place, and time.     Coordination: Coordination normal.     Comments: Speech is clear, able to follow commands CN III-XII intact Normal strength in upper and lower extremities bilaterally including dorsiflexion and plantar flexion, strong and equal grip strength Sensation normal to light and sharp touch Moves extremities without ataxia, coordination intact  Psychiatric:        Mood and Affect: Mood normal.        Behavior: Behavior normal.     ED Results / Procedures / Treatments   Labs (all labs ordered are listed, but only abnormal results are displayed) Labs Reviewed - No data to display  EKG None  Radiology CT Head Wo Contrast  Result Date: 09/18/2020 CLINICAL DATA:  "Pt arrived via POV, c/o head injury, states she hit head off of car while getting in last week. States LOC at the time. Has also been having ongoing neck pain for months that this incident made worse. States vision issues the last two days as well. " EXAM: CT HEAD WITHOUT CONTRAST CT CERVICAL SPINE WITHOUT CONTRAST TECHNIQUE: Multidetector CT imaging of the head and cervical spine was performed following the standard protocol without intravenous contrast. Multiplanar CT image reconstructions of the cervical spine were also generated. COMPARISON:  None. FINDINGS: CT HEAD FINDINGS Brain: No evidence of acute infarction,  hemorrhage, hydrocephalus, extra-axial collection or mass lesion/mass effect. Vascular: No hyperdense vessel or unexpected calcification. Skull: Normal. Negative for fracture or focal lesion. Sinuses/Orbits: Globes and orbits are unremarkable. Visualized sinuses are clear. Other: None. CT CERVICAL SPINE FINDINGS Alignment: Normal. Skull base and vertebrae: No acute fracture. No primary bone lesion or focal pathologic process. Soft tissues and spinal canal: No prevertebral fluid or swelling. No visible canal hematoma. Disc levels: Mild loss of disc height at C5-C6 and C6-C7 with endplate spurring and mild spondylotic disc bulging. Remaining cervical discs are relatively well preserved in height. No convincing disc herniation. Bilateral facet degenerative changes, greatest on the right at C2-C3 and on the left at C3-C4 and C4-C5. Upper chest: Negative. Other: None. IMPRESSION: HEAD CT 1. Normal. CERVICAL CT 1. No fracture or acute finding. 2. Degenerative changes as described. Electronically Signed   By: Lajean Manes M.D.   On: 09/18/2020 15:20   CT Cervical Spine Wo Contrast  Result Date: 09/18/2020 CLINICAL DATA:  "Pt arrived via POV, c/o head injury, states she hit head off of car while getting in last week. States LOC at the time. Has also been having ongoing neck pain for months that this incident made worse. States vision issues the last two days as well. " EXAM: CT HEAD WITHOUT CONTRAST CT CERVICAL SPINE WITHOUT CONTRAST TECHNIQUE: Multidetector CT imaging of the head and cervical spine was performed following the standard protocol without intravenous contrast. Multiplanar CT image reconstructions of the cervical spine were also generated. COMPARISON:  None. FINDINGS: CT HEAD FINDINGS Brain: No evidence of acute infarction, hemorrhage, hydrocephalus, extra-axial collection or mass lesion/mass effect. Vascular: No hyperdense vessel or unexpected calcification. Skull: Normal. Negative for fracture or focal  lesion. Sinuses/Orbits: Globes and orbits are unremarkable. Visualized sinuses are clear.  Other: None. CT CERVICAL SPINE FINDINGS Alignment: Normal. Skull base and vertebrae: No acute fracture. No primary bone lesion or focal pathologic process. Soft tissues and spinal canal: No prevertebral fluid or swelling. No visible canal hematoma. Disc levels: Mild loss of disc height at C5-C6 and C6-C7 with endplate spurring and mild spondylotic disc bulging. Remaining cervical discs are relatively well preserved in height. No convincing disc herniation. Bilateral facet degenerative changes, greatest on the right at C2-C3 and on the left at C3-C4 and C4-C5. Upper chest: Negative. Other: None. IMPRESSION: HEAD CT 1. Normal. CERVICAL CT 1. No fracture or acute finding. 2. Degenerative changes as described. Electronically Signed   By: Lajean Manes M.D.   On: 09/18/2020 15:20    Procedures Procedures   Medications Ordered in ED Medications  lidocaine (LIDODERM) 5 % 1 patch (1 patch Transdermal Patch Applied 09/18/20 1336)  ketorolac (TORADOL) 30 MG/ML injection 30 mg (30 mg Intramuscular Given 09/18/20 1336)  diazepam (VALIUM) tablet 5 mg (5 mg Oral Given 09/18/20 1336)  oxyCODONE-acetaminophen (PERCOCET/ROXICET) 5-325 MG per tablet 1 tablet (1 tablet Oral Given 09/18/20 1336)    ED Course  I have reviewed the triage vital signs and the nursing notes.  Pertinent labs & imaging results that were available during my care of the patient were reviewed by me and considered in my medical decision making (see chart for details).    MDM Rules/Calculators/A&P                         45 yo F presents with neck pain after recent head injury, was already dealing with several months of neck pain, now exacerbated. No focal neuro deficits, palpable muscle spasm. CT imaging of head and neck with no acute findings, chronic degenerative changes noted in C-spine. Pain treated here in ED with improvement. Suspect muscle spasm  worsening chronic neck pain. Will continue to treat supportively and have pt follow up with neurosurgery. Pt expresses understanding and agrees with plan. Discharged home in good condition.  Final Clinical Impression(s) / ED Diagnoses Final diagnoses:  Injury of head, initial encounter  Neck pain    Rx / DC Orders ED Discharge Orders         Ordered    tiZANidine (ZANAFLEX) 4 MG tablet  Every 6 hours PRN        09/18/20 1652    naproxen (NAPROSYN) 500 MG tablet  2 times daily        09/18/20 1652    methylPREDNISolone (MEDROL DOSEPAK) 4 MG TBPK tablet        09/18/20 1652           Benedetto Goad [place], [place] 09/24/20 0054    Lucrezia Starch, MD 09/24/20 2045

## 2020-09-18 NOTE — Discharge Instructions (Addendum)
CT of the head looks good, CT of her neck shows arthritis and degenerative changes that are likely contributing to your pain.  Take Medrol Dosepak as directed and use naproxen and tizanidine and a different muscle relaxer which would hopefully work better for you.  Do not combine this with Robaxin.  Please follow-up with your spine specialist or with Dr. Ronnald Ramp with [facility] neurosurgery for further evaluation.

## 2020-09-18 NOTE — ED Notes (Signed)
Patient back from CT at this time

## 2020-09-18 NOTE — ED Notes (Signed)
Patient transported to CT 

## 2020-09-18 NOTE — ED Triage Notes (Signed)
Pt arrived via POV, c/o head injury, states she hit head off of car while getting in last week. States LOC at the time. Has also been having ongoing neck pain for months that this incident made worse. States vision issues the last two days as well.

## 2020-09-18 NOTE — ED Notes (Signed)
Patient ambulated to and from bathroom at this time, stable gait 

## 2020-09-23 NOTE — ED Provider Notes (Incomplete)
[facility] DEPT Provider Note   CSN: [id number] Arrival date & time: 09/18/20  1228     History Chief Complaint  Patient presents with  . Head Injury    Daisy Tucker is a 45 y.o. female.  Daisy Tucker is a 45 y.o. female         Past Medical History:  Diagnosis Date  . Anxiety   . Drug use complicating pregnancy 79/0/2409   05/04/13 drug screen showed negative for benzos - DO NOT GIVE REFILL ON PRESCRIPTION FOR BENZOS!!!!! Positive for prescribed Klonopin.  Also positive for Suboxone/Subutex and unknown opiate.  Needs drug screening again in third trimester.     Marland Kitchen GERD (gastroesophageal reflux disease)   . Miscarriage    Rh negative  . Nausea   . PNA (pneumonia)   . Rh negative status during pregnancy in second trimester, antepartum 03/31/2013  . Supervision of high-risk pregnancy 03/01/2013   05/04/13 drug screen showed negative for benzos - DO NOT GIVE REFILL ON PRESCRIPTION FOR BENZOS!!!!!  Clinic [facility]  Dating  Currently based on LMP.    Genetic Screen Panorama requested--drawn 10/30--WNL  Anatomic US wnl--limited views of heart--repeat in 4 weeks--ordered--WNL  GTT   Third trimester: 1 hour 107  TDaP vaccine declines  Flu vaccine declines  GBS neg  Baby Food  breast   Contraception    . Vaginal Pap smear, abnormal    f/u was ok    Patient Active Problem List   Diagnosis Date Noted  . Nicotine dependence 09/07/2016  . Tinea corporis 06/08/2013  . GERD (gastroesophageal reflux disease) 05/04/2013  . Edema 01/05/2013  . Lumbar spondylosis with myelopathy 09/29/2012  . GAD (generalized anxiety disorder) 02/04/2012    Past Surgical History:  Procedure Laterality Date  . SPINE SURGERY  2008   L4-5 disc reconstruction     OB History    Gravida  3   Para  2   Term  2   Preterm  0   AB  1   Living  2     SAB  0   IAB  1   Ectopic  0   Multiple  0   Live Births  2           Family History  Problem  Relation Age of Onset  . Stroke Father        x 3  . Alcohol abuse Mother   . Alcohol abuse Brother   . Drug abuse Brother        IVD  . Cancer Maternal Grandmother        lung  . Stroke Paternal Grandmother   . Hearing loss Neg Hx     Social History   Tobacco Use  . Smoking status: Former Smoker    Types: Cigarettes    Quit date: 03/28/2013    Years since quitting: 7.4  . Smokeless tobacco: Never Used  . Tobacco comment: patient is on electronic cigarette ; she is trying to quit.  Vaping Use  . Vaping Use: Every day  Substance Use Topics  . Alcohol use: No  . 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 Authorizing Provider  methylPREDNISolone (MEDROL DOSEPAK) 4 MG TBPK tablet  Take as directed 09/18/20  Yes Jacqlyn Larsen, PA-C  naproxen (NAPROSYN) 500 MG tablet Take 1 tablet (500 mg total) by mouth 2 (two) times daily. 09/18/20  Yes Jacqlyn Larsen, PA-C  tiZANidine (ZANAFLEX) 4 MG tablet Take 1 tablet (4 mg total) by mouth every 6 (six) hours as needed for muscle spasms. 09/18/20  Yes Selyna Klahn, Audery Amel, PA-C  clonazePAM (KLONOPIN) 1 MG tablet TAKE 1 TABLET BY MOUTH 2 TIMES DAILY AS NEEDED FOR ANXIETY 10/09/17   Harrison Mons, PA  clonazePAM (KLONOPIN) 1 MG tablet Take 1 tablet (1 mg total) by mouth 2 (two) times daily as needed for anxiety. 11/08/17   Harrison Mons, PA  clonazePAM (KLONOPIN) 1 MG tablet Take 1 tablet (1 mg total) by mouth 2 (two) times daily as needed for anxiety. 12/08/17   Harrison Mons, PA  promethazine (PHENERGAN) 25 MG tablet TAKE 1 TABLET BY MOUTH EVERY 8 HOURS AS NEEDED FOR NAUSEA 09/02/17   Harrison Mons, PA    Allergies    Aspirin and Zofran  Review of Systems   Review of Systems  Constitutional: Negative for chills and fever.   HENT: Negative.   Eyes: Negative for visual disturbance.  Respiratory: Negative for shortness of breath.   Cardiovascular: Negative for chest pain.  Musculoskeletal: Positive for neck pain and neck stiffness. Negative for arthralgias, back pain and myalgias.  Skin: Negative for color change, rash and wound.  Neurological: Positive for headaches. Negative for dizziness, syncope, weakness and numbness.  All other systems reviewed and are negative.   Physical Exam Updated Vital Signs BP (!) 147/97 (BP Location: Left Arm)   Pulse (!) 118   Temp 97.7 F (36.5 C) (Oral)   Resp 18   LMP 09/18/2020   SpO2 99%   Physical Exam Vitals and nursing note reviewed.  Constitutional:      General: She is not in acute distress.    Appearance: Normal appearance. She is well-developed. She is not ill-appearing or diaphoretic.     Comments: Pt appears uncomfortable, but in no acute distress  HENT:     Head: Normocephalic and atraumatic.  Eyes:     General:        Right eye: No discharge.        Left eye: No discharge.  Neck:     Comments: Tenderness over the posterior neck musculature with palpable spasm bilaterally, focal midline tenderness over C7 spinous process, no stepoff or deformity Pulmonary:     Effort: Pulmonary effort is normal. No respiratory distress.  Musculoskeletal:     Cervical back: Tenderness present.  Neurological:     Mental Status: She is alert.     Coordination: Coordination normal.  Psychiatric:        Behavior: Behavior normal.     ED Results / Procedures / Treatments   Labs (all labs ordered are listed, but only abnormal results are displayed) Labs Reviewed - No data to display  EKG None  Radiology CT Head Wo Contrast  Result Date: 09/18/2020 CLINICAL DATA:  "Pt arrived via POV, c/o head injury, states she hit head off of car while getting in last week. States LOC at the time. Has also been having ongoing neck pain for months that this incident made  worse. States vision issues the last two days as well. " EXAM: CT HEAD WITHOUT CONTRAST CT CERVICAL SPINE WITHOUT CONTRAST TECHNIQUE: Multidetector CT imaging of the head and cervical spine was performed following the standard protocol without intravenous contrast.  Multiplanar CT image reconstructions of the cervical spine were also generated. COMPARISON:  None. FINDINGS: CT HEAD FINDINGS Brain: No evidence of acute infarction, hemorrhage, hydrocephalus, extra-axial collection or mass lesion/mass effect. Vascular: No hyperdense vessel or unexpected calcification. Skull: Normal. Negative for fracture or focal lesion. Sinuses/Orbits: Globes and orbits are unremarkable. Visualized sinuses are clear. Other: None. CT CERVICAL SPINE FINDINGS Alignment: Normal. Skull base and vertebrae: No acute fracture. No primary bone lesion or focal pathologic process. Soft tissues and spinal canal: No prevertebral fluid or swelling. No visible canal hematoma. Disc levels: Mild loss of disc height at C5-C6 and C6-C7 with endplate spurring and mild spondylotic disc bulging. Remaining cervical discs are relatively well preserved in height. No convincing disc herniation. Bilateral facet degenerative changes, greatest on the right at C2-C3 and on the left at C3-C4 and C4-C5. Upper chest: Negative. Other: None. IMPRESSION: HEAD CT 1. Normal. CERVICAL CT 1. No fracture or acute finding. 2. Degenerative changes as described. Electronically Signed   By: Lajean Manes M.D.   On: 09/18/2020 15:20   CT Cervical Spine Wo Contrast  Result Date: 09/18/2020 CLINICAL DATA:  "Pt arrived via POV, c/o head injury, states she hit head off of car while getting in last week. States LOC at the time. Has also been having ongoing neck pain for months that this incident made worse. States vision issues the last two days as well. " EXAM: CT HEAD WITHOUT CONTRAST CT CERVICAL SPINE WITHOUT CONTRAST TECHNIQUE: Multidetector CT imaging of the head and cervical  spine was performed following the standard protocol without intravenous contrast. Multiplanar CT image reconstructions of the cervical spine were also generated. COMPARISON:  None. FINDINGS: CT HEAD FINDINGS Brain: No evidence of acute infarction, hemorrhage, hydrocephalus, extra-axial collection or mass lesion/mass effect. Vascular: No hyperdense vessel or unexpected calcification. Skull: Normal. Negative for fracture or focal lesion. Sinuses/Orbits: Globes and orbits are unremarkable. Visualized sinuses are clear. Other: None. CT CERVICAL SPINE FINDINGS Alignment: Normal. Skull base and vertebrae: No acute fracture. No primary bone lesion or focal pathologic process. Soft tissues and spinal canal: No prevertebral fluid or swelling. No visible canal hematoma. Disc levels: Mild loss of disc height at C5-C6 and C6-C7 with endplate spurring and mild spondylotic disc bulging. Remaining cervical discs are relatively well preserved in height. No convincing disc herniation. Bilateral facet degenerative changes, greatest on the right at C2-C3 and on the left at C3-C4 and C4-C5. Upper chest: Negative. Other: None. IMPRESSION: HEAD CT 1. Normal. CERVICAL CT 1. No fracture or acute finding. 2. Degenerative changes as described. Electronically Signed   By: Lajean Manes M.D.   On: 09/18/2020 15:20    Procedures Procedures {Remember to document critical care time when appropriate:1}  Medications Ordered in ED Medications  lidocaine (LIDODERM) 5 % 1 patch (1 patch Transdermal Patch Applied 09/18/20 1336)  ketorolac (TORADOL) 30 MG/ML injection 30 mg (30 mg Intramuscular Given 09/18/20 1336)  diazepam (VALIUM) tablet 5 mg (5 mg Oral Given 09/18/20 1336)  oxyCODONE-acetaminophen (PERCOCET/ROXICET) 5-325 MG per tablet 1 tablet (1 tablet Oral Given 09/18/20 1336)    ED Course  I have reviewed the triage vital signs and the nursing notes.  Pertinent labs & imaging results that were available during my care of the patient  were reviewed by me and considered in my medical decision making (see chart for details).    MDM Rules/Calculators/A&P  Final Clinical Impression(s) / ED Diagnoses Final diagnoses:  Injury of head, initial encounter  Neck pain    Rx / DC Orders ED Discharge Orders         Ordered    tiZANidine (ZANAFLEX) 4 MG tablet  Every 6 hours PRN        09/18/20 1652    naproxen (NAPROSYN) 500 MG tablet  2 times daily        09/18/20 1652    methylPREDNISolone (MEDROL DOSEPAK) 4 MG TBPK tablet        09/18/20 1652

## 2020-12-24 ENCOUNTER — Other Ambulatory Visit: Payer: Self-pay | Admitting: Orthopaedic Surgery

## 2020-12-24 DIAGNOSIS — M5412 Radiculopathy, cervical region: Secondary | ICD-10-CM

## 2021-01-04 ENCOUNTER — Other Ambulatory Visit: Payer: 59

## 2022-03-11 IMAGING — CT CT HEAD W/O CM
3 series · 14 of 46 positions shown, 16 images · non-contrast
Comparison: None.

CLINICAL DATA: "Pt arrived via POV, c/o head injury, states she hit
head off of car while getting in last week. States LOC at the time.
Has also been having ongoing neck pain for months that this incident
made worse. States vision issues the last two days as well. "

EXAM:
CT HEAD WITHOUT CONTRAST
CT CERVICAL SPINE WITHOUT CONTRAST
TECHNIQUE: Multidetector CT imaging of the head and cervical spine was
performed following the standard protocol without intravenous
contrast. Multiplanar CT image reconstructions of the cervical spine
were also generated.

[Series 2: head wo · axial · 0.47mm/px · z∈[+1311,+1431]mm · 8 of 29 slices shown, 10 images]
[im 3/29  brain]
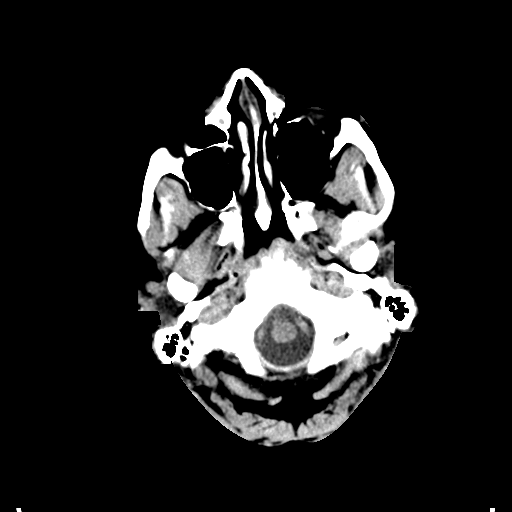
[im 3/29  bone]
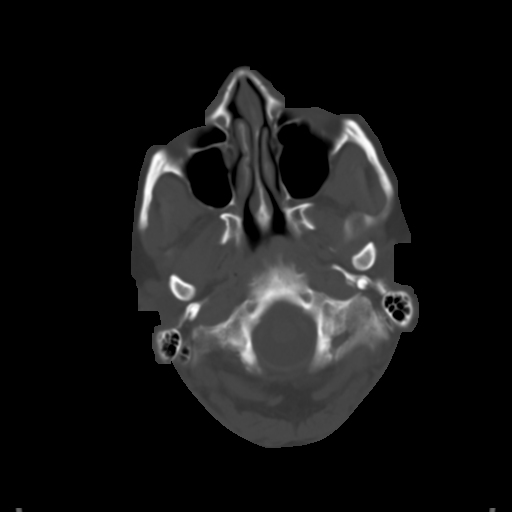
[im 7/29  brain]
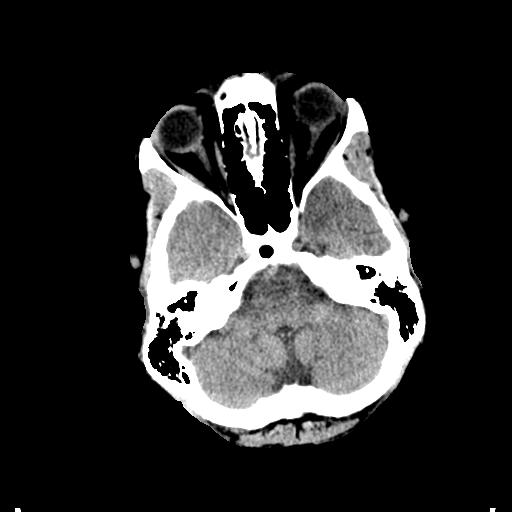
[im 10/29  brain]
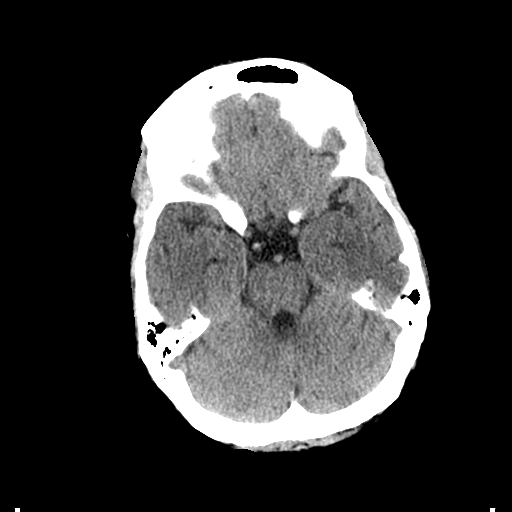
[im 13/29  brain]
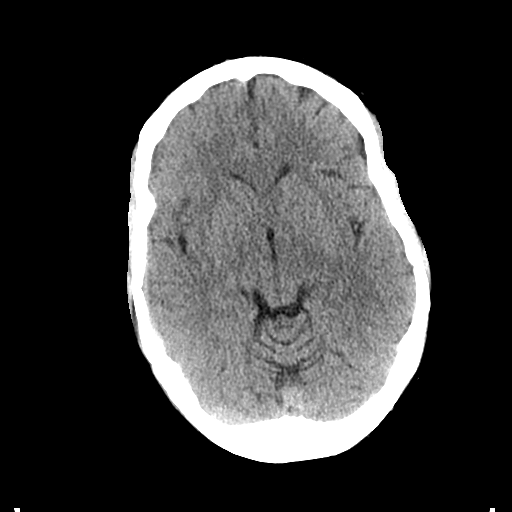
[im 17/29  brain]
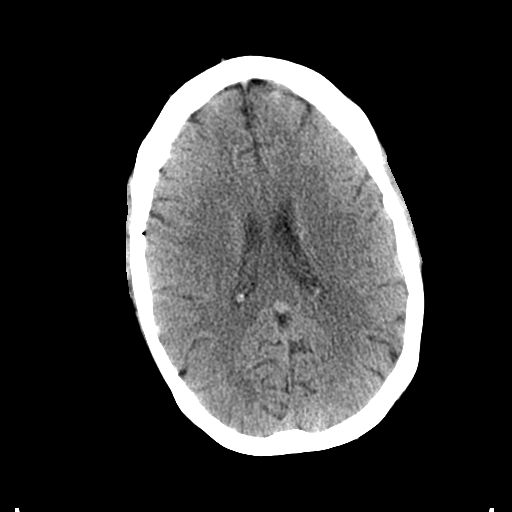
[im 17/29  bone]
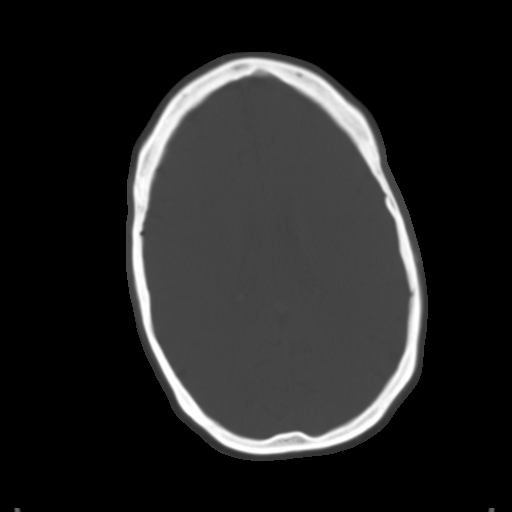
[im 20/29  brain]
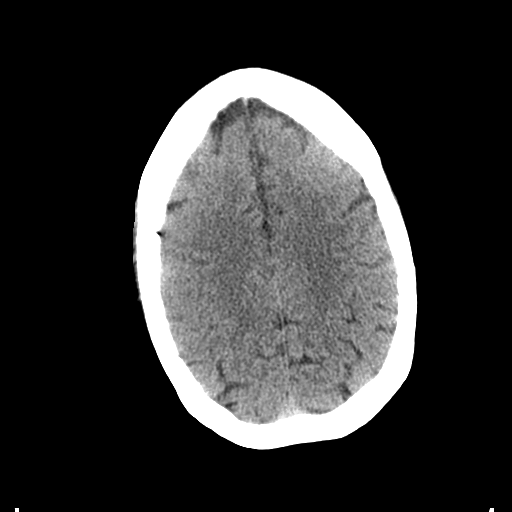
[im 23/29  brain]
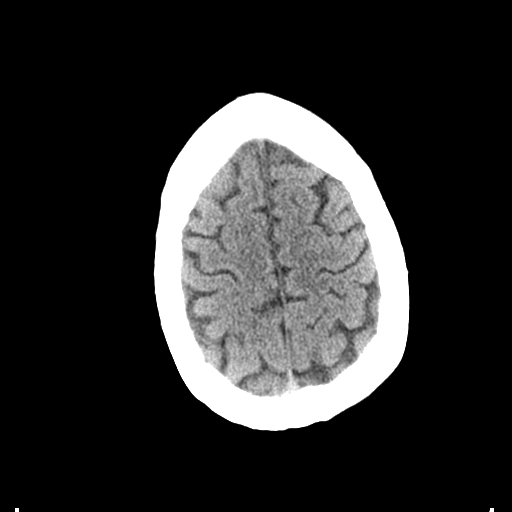
[im 27/29  brain]
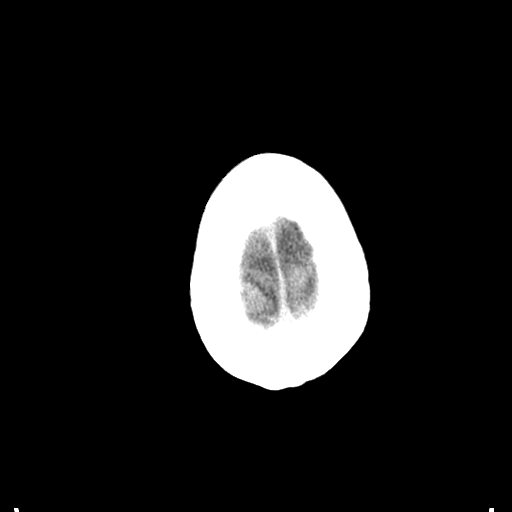

[Series 4: coronal soft tissue · coronal · 0.36mm/px · 3 of 66 slices shown]
[im 22/66  brain]
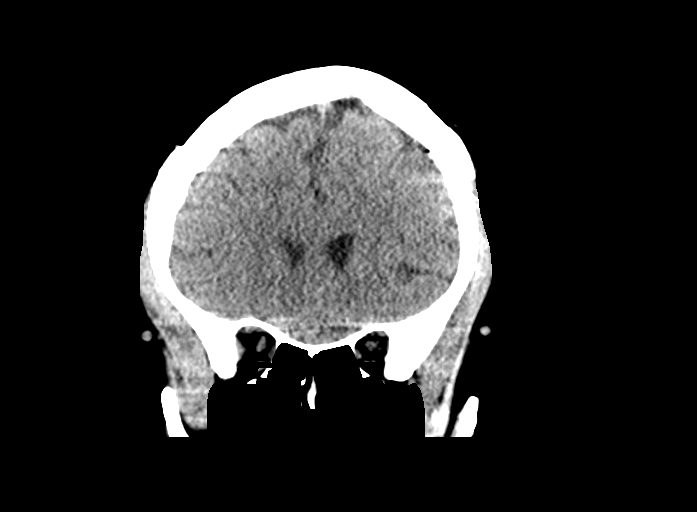
[im 29/66  brain]
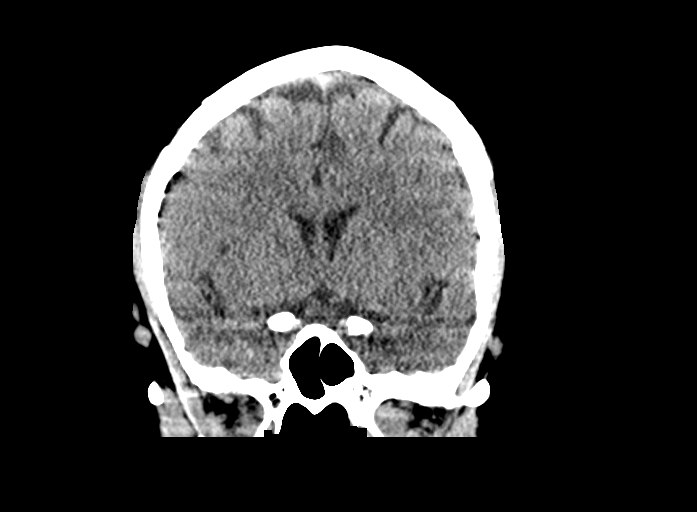
[im 37/66  brain]
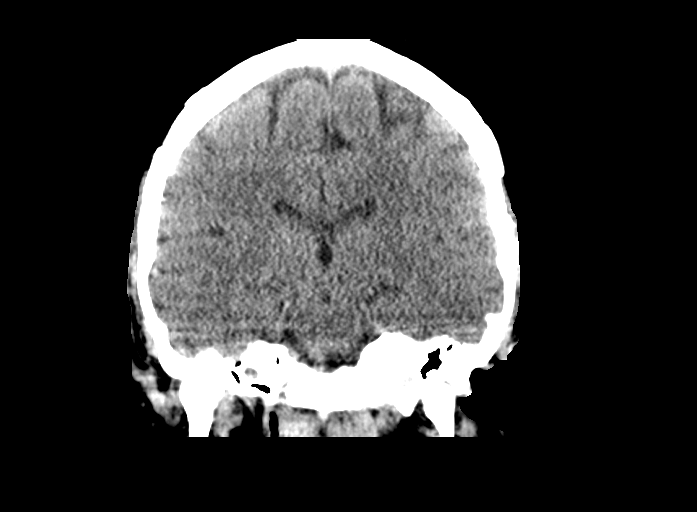

[Series 5: sagittal soft tissue · sagittal · 0.35mm/px · 3 of 48 slices shown]
[im 16/48  brain]
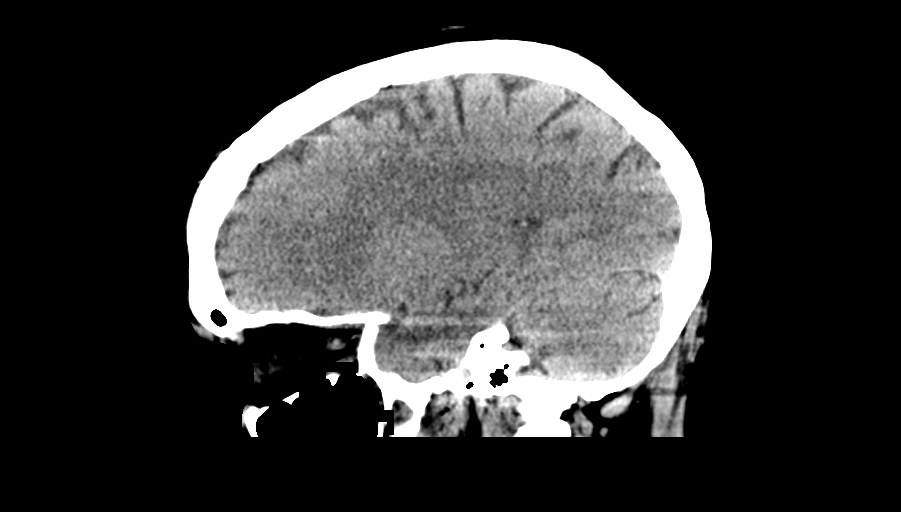
[im 24/48  brain]
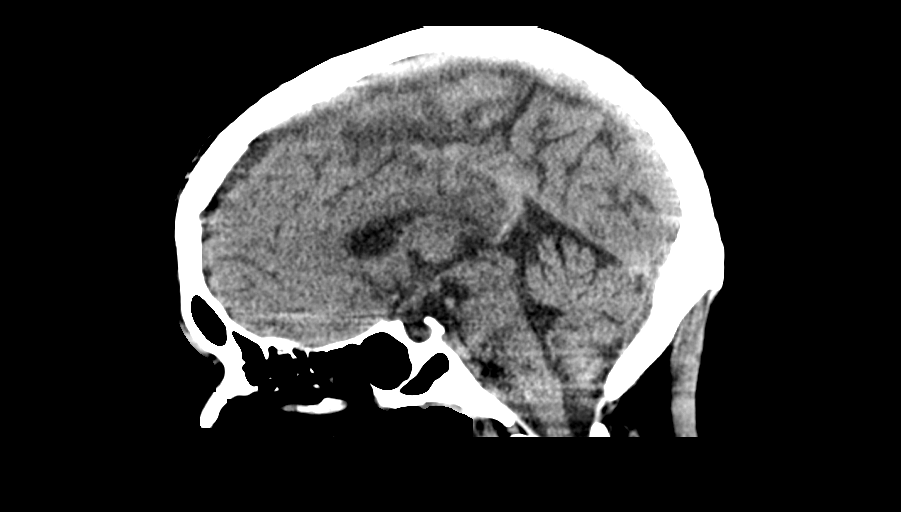
[im 32/48  brain]
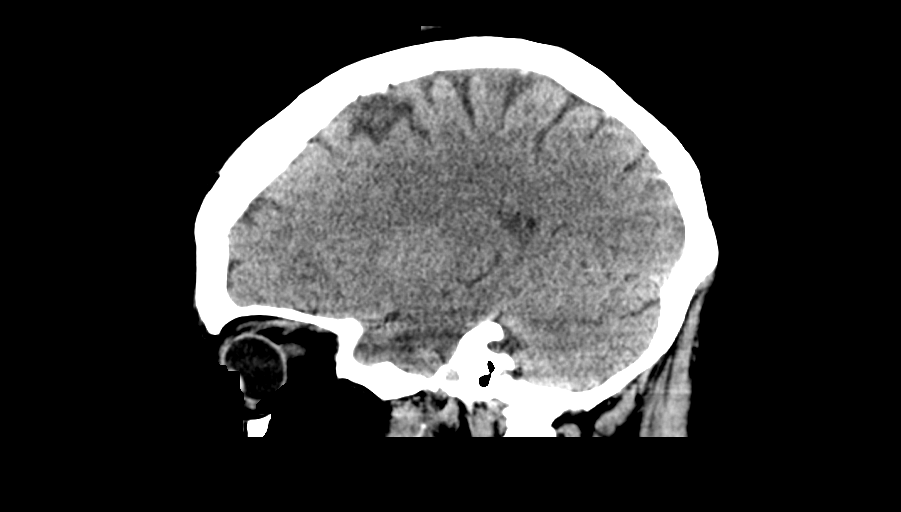

[14 of 46 positions shown; findings below may reference images not displayed]

FINDINGS: CT HEAD FINDINGS

Brain: No evidence of acute infarction, hemorrhage, hydrocephalus,
extra-axial collection or mass lesion/mass effect.

Vascular: No hyperdense vessel or unexpected calcification.

Skull: Normal. Negative for fracture or focal lesion.

Sinuses/Orbits: Globes and orbits are unremarkable. Visualized
sinuses are clear.

Other: None.

CT CERVICAL SPINE FINDINGS

Alignment: Normal.

Skull base and vertebrae: No acute fracture. No primary bone lesion
or focal pathologic process.

Soft tissues and spinal canal: No prevertebral fluid or swelling. No
visible canal hematoma.

Disc levels: Mild loss of disc height at C5-C6 and C6-C7 with
endplate spurring and mild spondylotic disc bulging. Remaining
cervical discs are relatively well preserved in height. No
convincing disc herniation. Bilateral facet degenerative changes,
greatest on the right at C2-C3 and on the left at C3-C4 and C4-C5.

Upper chest: Negative.

Other: None.
IMPRESSION: HEAD CT

1. Normal.

CERVICAL CT

1. No fracture or acute finding.
2. Degenerative changes as described.

## 2023-06-26 ENCOUNTER — Encounter (HOSPITAL_COMMUNITY): Payer: Self-pay

## 2023-06-26 ENCOUNTER — Emergency Department (HOSPITAL_COMMUNITY)
Admission: EM | Admit: 2023-06-26 | Discharge: 2023-06-27 | Disposition: A | Discharge status: Home / Self Care | Payer: Self-pay | Attending: Emergency Medicine | Admitting: Emergency Medicine

## 2023-06-26 DIAGNOSIS — K529 Noninfective gastroenteritis and colitis, unspecified: Secondary | ICD-10-CM

## 2023-06-26 DIAGNOSIS — R112 Nausea with vomiting, unspecified: Secondary | ICD-10-CM | POA: Insufficient documentation

## 2023-06-26 DIAGNOSIS — Z20822 Contact with and (suspected) exposure to covid-19: Secondary | ICD-10-CM | POA: Insufficient documentation

## 2023-06-26 DIAGNOSIS — R509 Fever, unspecified: Secondary | ICD-10-CM | POA: Insufficient documentation

## 2023-06-26 DIAGNOSIS — R197 Diarrhea, unspecified: Secondary | ICD-10-CM | POA: Insufficient documentation

## 2023-06-26 LAB — URINALYSIS, ROUTINE W REFLEX MICROSCOPIC
Bacteria, UA: NONE SEEN
Bilirubin Urine: NEGATIVE
Glucose, UA: NEGATIVE mg/dL
Ketones, ur: NEGATIVE mg/dL
Nitrite: NEGATIVE
Protein, ur: 100 mg/dL — AB
RBC / HPF: >50 RBC/hpf (ref 0–5)
Specific Gravity, Urine: 1.023 (ref 1.005–1.030)
WBC, UA: >50 WBC/hpf (ref 0–5)
pH: 5 (ref 5.0–8.0)

## 2023-06-26 LAB — CBC
HCT: 34 % — ABNORMAL LOW (ref 36.0–46.0)
Hemoglobin: 10.6 g/dL — ABNORMAL LOW (ref 12.0–15.0)
MCH: 26.2 pg (ref 26.0–34.0)
MCHC: 31.2 g/dL (ref 30.0–36.0)
MCV: 84 fL (ref 80.0–100.0)
Platelets: 128 10*3/uL — ABNORMAL LOW (ref 150–400)
RBC: 4.05 MIL/uL (ref 3.87–5.11)
RDW: 17 % — ABNORMAL HIGH (ref 11.5–15.5)
WBC: 7.7 10*3/uL (ref 4.0–10.5)
nRBC: 0 % (ref 0.0–0.2)

## 2023-06-26 LAB — COMPREHENSIVE METABOLIC PANEL
ALT: 26 U/L (ref 0–44)
AST: 43 U/L — ABNORMAL HIGH (ref 15–41)
Albumin: 3.7 g/dL (ref 3.5–5.0)
Alkaline Phosphatase: 56 U/L (ref 38–126)
Anion gap: 15 (ref 5–15)
BUN: 8 mg/dL (ref 6–20)
CO2: 21 mmol/L — ABNORMAL LOW (ref 22–32)
Calcium: 9 mg/dL (ref 8.9–10.3)
Chloride: 95 mmol/L — ABNORMAL LOW (ref 98–111)
Creatinine, Ser: 0.67 mg/dL (ref 0.44–1.00)
GFR, Estimated: >60 mL/min (ref >60)
Glucose, Bld: 116 mg/dL — ABNORMAL HIGH (ref 70–99)
Potassium: 3 mmol/L — ABNORMAL LOW (ref 3.5–5.1)
Sodium: 131 mmol/L — ABNORMAL LOW (ref 135–145)
Total Bilirubin: 0.4 mg/dL (ref 0.0–1.2)
Total Protein: 9.2 g/dL — ABNORMAL HIGH (ref 6.5–8.1)

## 2023-06-26 LAB — HCG, SERUM, QUALITATIVE: Preg, Serum: NEGATIVE

## 2023-06-26 LAB — RAPID URINE DRUG SCREEN, HOSP PERFORMED
Amphetamines: NOT DETECTED
Barbiturates: NOT DETECTED
Benzodiazepines: POSITIVE — AB
Cocaine: NOT DETECTED
Opiates: NOT DETECTED
Tetrahydrocannabinol: POSITIVE — AB

## 2023-06-26 LAB — LIPASE, BLOOD: Lipase: 26 U/L (ref 11–51)

## 2023-06-26 MED ORDER — METOCLOPRAMIDE HCL 5 MG/ML IJ SOLN
10.0000 mg | Freq: Once | INTRAMUSCULAR | Status: AC
  Administered 2023-06-26: 10 mg via INTRAVENOUS
  Filled 2023-06-26: qty 2

## 2023-06-26 MED ORDER — KETOROLAC TROMETHAMINE 15 MG/ML IJ SOLN
15.0000 mg | Freq: Once | INTRAMUSCULAR | Status: AC
  Administered 2023-06-26: 15 mg via INTRAVENOUS
  Filled 2023-06-26: qty 1

## 2023-06-26 MED ORDER — SODIUM CHLORIDE 0.9 % IV BOLUS
1000.0000 mL | Freq: Once | INTRAVENOUS | Status: AC
  Administered 2023-06-26: 1000 mL via INTRAVENOUS

## 2023-06-26 NOTE — ED Provider Notes (Signed)
 [place] EMERGENCY DEPARTMENT AT [facility] Provider Note   CSN: [id number] Arrival date & time: 06/26/23  2042     History  Chief Complaint  Patient presents with   Abdominal Pain    Daisy Tucker is a 48 y.o. female concern for fever nausea vomiting diarrhea x 5 days with Tmax 104 F at home treating with Tylenol , has not been taking ibuprofen .  States that her fever became so high at 1 point she had severe shaking, thought she may have had a seizure but was awake and communicating verbally with her husband throughout the episode.  I have reviewed her medical records.  History of polysubstance use, generalized anxiety disorder on Klonopin .  No known ill contacts that she does have a child.  Endorses occasional recreational cannabis usage.  HPI     Home Medications Prior to Admission medications   Medication Sig Start Date End Date Taking? Authorizing Provider  clonazePAM  (KLONOPIN ) 1 MG tablet TAKE 1 TABLET BY MOUTH 2 TIMES DAILY AS NEEDED FOR ANXIETY 10/09/17   Juliane Che, PA  clonazePAM  (KLONOPIN ) 1 MG tablet Take 1 tablet (1 mg total) by mouth 2 (two) times daily as needed for anxiety. 11/08/17   Juliane Che, PA  clonazePAM  (KLONOPIN ) 1 MG tablet Take 1 tablet (1 mg total) by mouth 2 (two) times daily as needed for anxiety. 12/08/17   Juliane Che, PA  methylPREDNISolone  (MEDROL  DOSEPAK) 4 MG TBPK tablet Take as directed 09/18/20   Kehrli, Kelsey F, PA-C  naproxen  (NAPROSYN ) 500 MG tablet Take 1 tablet (500 mg total) by mouth 2 (two) times daily. 09/18/20   Kehrli, Kelsey F, PA-C  promethazine  (PHENERGAN ) 25 MG tablet TAKE 1 TABLET BY MOUTH EVERY 8 HOURS AS NEEDED FOR NAUSEA 09/02/17   Juliane Che, PA  tiZANidine  (ZANAFLEX ) 4 MG tablet Take 1 tablet (4 mg total) by mouth every 6 (six) hours as needed for muscle spasms. 09/18/20   Kehrli, Kelsey F, PA-C      Allergies    Aspirin and Zofran     Review of Systems   Review of Systems  Constitutional:   Positive for chills, fatigue and fever.  HENT: Negative.    Respiratory: Negative.    Cardiovascular: Negative.   Gastrointestinal:  Positive for diarrhea, nausea and vomiting.  Genitourinary: Negative.   Musculoskeletal:  Positive for myalgias.    Physical Exam Updated Vital Signs BP 92/63   Pulse 80   Temp 99.8 F (37.7 C) (Oral)   Resp 14   SpO2 94%  Physical Exam Vitals and nursing note reviewed.  Constitutional:      Appearance: She is not ill-appearing or toxic-appearing.  HENT:     Head: Normocephalic and atraumatic.     Mouth/Throat:     Mouth: Mucous membranes are moist.     Pharynx: No oropharyngeal exudate or posterior oropharyngeal erythema.  Eyes:     General:        Right eye: No discharge.        Left eye: No discharge.     Conjunctiva/sclera: Conjunctivae normal.  Cardiovascular:     Rate and Rhythm: Normal rate and regular rhythm.     Pulses: Normal pulses.     Heart sounds: Normal heart sounds. No murmur heard. Pulmonary:     Effort: Pulmonary effort is normal. No respiratory distress.     Breath sounds: Normal breath sounds. No wheezing or rales.  Abdominal:     General: Bowel sounds are normal. There  is no distension.     Palpations: Abdomen is soft.     Tenderness: There is no abdominal tenderness. There is no right CVA tenderness, left CVA tenderness, guarding or rebound.  Musculoskeletal:        General: No deformity.     Cervical back: Neck supple.  Skin:    General: Skin is warm and dry.     Capillary Refill: Capillary refill takes less than 2 seconds.  Neurological:     General: No focal deficit present.     Mental Status: She is alert. Mental status is at baseline.  Psychiatric:        Mood and Affect: Mood normal.     ED Results / Procedures / Treatments   Labs (all labs ordered are listed, but only abnormal results are displayed) Labs Reviewed  COMPREHENSIVE METABOLIC PANEL - Abnormal; Notable for the following components:       Result Value   Sodium 131 (*)    Potassium 3.0 (*)    Chloride 95 (*)    CO2 21 (*)    Glucose, Bld 116 (*)    Total Protein 9.2 (*)    AST 43 (*)    All other components within normal limits  CBC - Abnormal; Notable for the following components:   Hemoglobin 10.6 (*)    HCT 34.0 (*)    RDW 17.0 (*)    Platelets 128 (*)    All other components within normal limits  URINALYSIS, ROUTINE W REFLEX MICROSCOPIC - Abnormal; Notable for the following components:   Color, Urine RED (*)    APPearance CLOUDY (*)    Hgb urine dipstick MODERATE (*)    Protein, ur 100 (*)    Leukocytes,Ua MODERATE (*)    All other components within normal limits  RAPID URINE DRUG SCREEN, HOSP PERFORMED - Abnormal; Notable for the following components:   Benzodiazepines POSITIVE (*)    Tetrahydrocannabinol POSITIVE (*)    All other components within normal limits  RESP PANEL BY RT-PCR (RSV, FLU A&B, COVID)  RVPGX2  LIPASE, BLOOD  HCG, SERUM, QUALITATIVE  ACETAMINOPHEN  LEVEL    EKG EKG Interpretation Date/Time:  Sunday June 27 2023 01:03:13 EST Ventricular Rate:  81 PR Interval:  112 QRS Duration:  101 QT Interval:  420 QTC Calculation: 488 R Axis:   59  Text Interpretation: Sinus rhythm Borderline short PR interval Inferolateral infarct, old Confirmed by Griselda Norris [phone]) on 06/27/2023 1:10:10 AM  Radiology No results found.  Procedures Procedures    Medications Ordered in ED Medications  sodium chloride  0.9 % bolus 1,000 mL (1,000 mLs Intravenous New Bag/Given 06/26/23 2353)  metoCLOPramide  (REGLAN ) injection 10 mg (10 mg Intravenous Given 06/26/23 2354)  ketorolac  (TORADOL ) 15 MG/ML injection 15 mg (15 mg Intravenous Given 06/26/23 2354)    ED Course/ Medical Decision Making/ A&P                                 Medical Decision Making 48 year old female with concern for nausea, diarrhea fevers x 5 days.  Patient was tachycardic and tachypneic on intake but the nurse.  Time of my  evaluation, has normal heart rate, cardiopulmonary abdominal exams are unremarkable.  Resting comfortably in her hospital bed.   The differential diagnosis of diarrhea includes but is not limited to: Viral: norovirus/rotavirus, etc.  Bacterial-Campylobacter,Shigella, Salmonella, Escherichia coli, E. coli 0157:H7, Yersinia enterocolitica, Vibrio cholerae, Clostridium difficile. Parasitic- Giardia  lamblia, Cryptosporidium,Entamoeba histolytica,Cyclospora, Microsporidium. Toxin- Staphylococcus aureus, Bacillus cereus.  Noninfectious causes include GI Bleed, Appendicitis, Mesenteric Ischemia, Diverticulitis, Adrenal Crisis, Thyroid Storm, Toxicologic exposures, Antibiotic or drug-associated, inflammatory bowel disease.   Amount and/or Complexity of Data Reviewed Labs: ordered.    Details:   CBC with mild anemia at patient's baseline, thrombocytopenia with platelets of 128, patient's baseline of 170.  CMP with hyponatremia to 131, hypokalemia of 3.  UA with moderate hemoglobin, proteinuria, rater than 50 RBCs and WBCs but no bacteria seen, nitrate negative.  Drug screen positive for benzos which she is prescribed as well as THC. ECG/medicine tests:     Details: EKG NSR  Risk Prescription drug management.   Clinical picture most consistent with acute viral etiology of patient's gastroenteritis, tolerating p.o. after IV antiemetic in the ED, afebrile at this time after Toradol .  Reassuring vital signs, clinical concern for emergent underlying condition that would warrant further ED workup and patient management at this time is exceedingly low.  Recommend close outpatient follow-up.  Lauren  voiced understanding of her medical evaluation and treatment plan. Each of their questions answered to their expressed satisfaction.  Return precautions were given.  Patient is well-appearing, stable, and was discharged in good condition.  This chart was dictated using voice recognition software, Dragon.  Despite the best efforts of this provider to proofread and correct errors, errors may still occur which can change documentation meaning.         Final Clinical Impression(s) / ED Diagnoses Final diagnoses:  None    Rx / DC Orders ED Discharge Orders     None         Bobette Pleasant SAUNDERS, PA-C 06/27/23 0255    Randol Simmonds, MD 06/29/23 [phone]

## 2023-06-26 NOTE — ED Triage Notes (Signed)
 Pt to ED by POV from home with c/o fever and NVD onset of 5 days ago. Pt reports highest fever at home 104. Pt states she has been taking OTC meds with short term relief. Arrives with an elevated HR, temp of 99.3 all other VSS. Pt denies any resp symptoms.

## 2023-06-27 LAB — ACETAMINOPHEN LEVEL: Acetaminophen (Tylenol), Serum: 11 ug/mL (ref 10–30)

## 2023-06-27 LAB — RESP PANEL BY RT-PCR (RSV, FLU A&B, COVID)  RVPGX2
Influenza A by PCR: NEGATIVE
Influenza B by PCR: NEGATIVE
Resp Syncytial Virus by PCR: NEGATIVE
SARS Coronavirus 2 by RT PCR: NEGATIVE

## 2023-06-27 MED ORDER — POTASSIUM CHLORIDE CRYS ER 20 MEQ PO TBCR
40.0000 meq | EXTENDED_RELEASE_TABLET | Freq: Once | ORAL | Status: AC
  Administered 2023-06-27: 40 meq via ORAL
  Filled 2023-06-27: qty 2

## 2023-06-27 MED ORDER — POTASSIUM CHLORIDE CRYS ER 20 MEQ PO TBCR: 20.0000 meq | EXTENDED_RELEASE_TABLET | Freq: Once | ORAL | 0 refills | Status: AC

## 2023-06-27 NOTE — Discharge Instructions (Signed)
 You likely have a viral illness causing your GI symptoms. You may use your nausea medication as needed at home. You may use immodium as needed for your diarrhea. Follow up with your PCP. Return to the ER with any new severe symptoms.
